# Patient Record
Sex: Female | Born: 1995 | Hispanic: No | Marital: Single | Smoking: Current every day smoker
Health system: Southern US, Community
[De-identification: ages and names within clinical notes are randomized; demographics above are authoritative.]

## PROBLEM LIST (undated history)

## (undated) DIAGNOSIS — B9689 Other specified bacterial agents as the cause of diseases classified elsewhere: Secondary | ICD-10-CM

## (undated) DIAGNOSIS — R569 Unspecified convulsions: Secondary | ICD-10-CM

## (undated) DIAGNOSIS — N760 Acute vaginitis: Secondary | ICD-10-CM

## (undated) DIAGNOSIS — G40909 Epilepsy, unspecified, not intractable, without status epilepticus: Secondary | ICD-10-CM

## (undated) DIAGNOSIS — J45909 Unspecified asthma, uncomplicated: Secondary | ICD-10-CM

## (undated) DIAGNOSIS — F419 Anxiety disorder, unspecified: Secondary | ICD-10-CM

## (undated) HISTORY — DX: Epilepsy, unspecified, not intractable, without status epilepticus: G40.909

## (undated) HISTORY — PX: NO PAST SURGERIES: SHX2092

---
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
## 2014-02-12 DIAGNOSIS — G40909 Epilepsy, unspecified, not intractable, without status epilepticus: Secondary | ICD-10-CM

## 2014-02-12 HISTORY — DX: Epilepsy, unspecified, not intractable, without status epilepticus: G40.909

## 2014-03-03 DIAGNOSIS — G40209 Localization-related (focal) (partial) symptomatic epilepsy and epileptic syndromes with complex partial seizures, not intractable, without status epilepticus: Secondary | ICD-10-CM | POA: Insufficient documentation

## 2014-03-26 ENCOUNTER — Ambulatory Visit: Payer: Self-pay | Admitting: Neurology
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
## 2016-11-01 DIAGNOSIS — G40919 Epilepsy, unspecified, intractable, without status epilepticus: Secondary | ICD-10-CM | POA: Insufficient documentation

## 2017-05-13 ENCOUNTER — Ambulatory Visit
Admission: EM | Admit: 2017-05-13 | Discharge: 2017-05-13 | Disposition: A | Discharge status: Home / Self Care | Payer: BLUE CROSS/BLUE SHIELD | Attending: Family Medicine | Admitting: Family Medicine

## 2017-05-13 ENCOUNTER — Encounter: Payer: Self-pay | Admitting: Gynecology

## 2017-05-13 ENCOUNTER — Other Ambulatory Visit: Payer: Self-pay

## 2017-05-13 DIAGNOSIS — R05 Cough: Secondary | ICD-10-CM

## 2017-05-13 DIAGNOSIS — B9789 Other viral agents as the cause of diseases classified elsewhere: Secondary | ICD-10-CM | POA: Diagnosis not present

## 2017-05-13 DIAGNOSIS — J069 Acute upper respiratory infection, unspecified: Secondary | ICD-10-CM | POA: Diagnosis not present

## 2017-05-13 HISTORY — DX: Unspecified convulsions: R56.9

## 2017-05-13 HISTORY — DX: Anxiety disorder, unspecified: F41.9

## 2017-05-13 HISTORY — DX: Unspecified asthma, uncomplicated: J45.909

## 2017-05-13 NOTE — ED Provider Notes (Addendum)
[facility]    CSN: [id number] Arrival date & time: 05/13/17  1256     History   Chief Complaint Chief Complaint  Patient presents with  . Cough    HPI Taylor Oneill is a 21 y.o. female.    Cough  Associated symptoms: rhinorrhea   Associated symptoms: no headaches and no wheezing   URI  Presenting symptoms: congestion, cough and rhinorrhea   Onset quality:  Sudden Duration:  3 days Timing:  Constant Progression:  Unchanged Chronicity:  New Relieved by:  OTC medications Associated symptoms: no headaches, no sinus pain and no wheezing   Risk factors: not elderly, no chronic cardiac disease, no chronic kidney disease, no chronic respiratory disease, no diabetes mellitus, no immunosuppression, no recent illness and no recent travel     Past Medical History:  Diagnosis Date  . Anxiety   . Asthma   . Seizure (HCC)     There are no active problems to display for this patient.   History reviewed. No pertinent surgical history.  OB History    No data available       Home Medications    Prior to Admission medications   Medication Sig Start Date End Date Taking? Authorizing Provider  lamoTRIgine (LAMICTAL) 150 MG tablet Take 150 mg by mouth daily.   Yes [provider]    Family History Family History  Problem Relation Age of Onset  . Seizures Mother   . Migraines Mother     Social History Social History   Tobacco Use  . Smoking status: Current Some Day Smoker    Packs/day: 0.50  . Smokeless tobacco: Never Used  Substance Use Topics  . Alcohol use: Yes  . Drug use: No     Allergies   Tylagesic [acetaminophen]   Review of Systems Review of Systems  HENT: Positive for congestion and rhinorrhea. Negative for sinus pain.   Respiratory: Positive for cough. Negative for wheezing.   Neurological: Negative for headaches.     Physical Exam Triage Vital Signs ED Triage Vitals  Enc Vitals Group     BP 05/13/17 1331  116/79     Pulse Rate 05/13/17 1331 69     Resp 05/13/17 1331 16     Temp 05/13/17 1331 98.7 F (37.1 C)     Temp Source 05/13/17 1331 Oral     SpO2 05/13/17 1331 100 %     Weight 05/13/17 1324 160 lb (72.6 kg)     Height 05/13/17 1324 5\' 6"  (1.676 m)     Head Circumference --      Peak Flow --      Pain Score 05/13/17 1324 3     Pain Loc --      Pain Edu? --      Excl. in GC? --    No data found.  Updated Vital Signs BP 116/79 (BP Location: Left Arm)   Pulse 69   Temp 98.7 F (37.1 C) (Oral)   Resp 16   Ht 5\' 6"  (1.676 m)   Wt 160 lb (72.6 kg)   LMP 05/05/2017   SpO2 100%   BMI 25.82 kg/m   Visual Acuity Right Eye Distance:   Left Eye Distance:   Bilateral Distance:    Right Eye Near:   Left Eye Near:    Bilateral Near:     Physical Exam  Constitutional: She appears well-developed and well-nourished. No distress.  HENT:  Head: Normocephalic and atraumatic.  Right  Ear: Tympanic membrane, external ear and ear canal normal.  Left Ear: Tympanic membrane, external ear and ear canal normal.  Nose: Rhinorrhea present. No mucosal edema, nose lacerations, sinus tenderness, nasal deformity, septal deviation or nasal septal hematoma. No epistaxis.  No foreign bodies. Right sinus exhibits no maxillary sinus tenderness and no frontal sinus tenderness. Left sinus exhibits no maxillary sinus tenderness and no frontal sinus tenderness.  Mouth/Throat: Uvula is midline and mucous membranes are normal. Posterior oropharyngeal erythema present. No oropharyngeal exudate or posterior oropharyngeal edema. No tonsillar exudate.  Eyes: Conjunctivae and EOM are normal. Pupils are equal, round, and reactive to light. Right eye exhibits no discharge. Left eye exhibits no discharge. No scleral icterus.  Neck: Normal range of motion. Neck supple. No thyromegaly present.  Cardiovascular: Normal rate, regular rhythm and normal heart sounds.  Pulmonary/Chest: Effort normal and breath sounds  normal. No respiratory distress. She has no wheezes. She has no rales.  Lymphadenopathy:    She has no cervical adenopathy.  Skin: She is not diaphoretic.  Nursing note and vitals reviewed.    UC Treatments / Results  Labs (all labs ordered are listed, but only abnormal results are displayed) Labs Reviewed - No data to display  EKG  EKG Interpretation None       Radiology No results found.  Procedures Procedures (including critical care time)  Medications Ordered in UC Medications - No data to display   Initial Impression / Assessment and Plan / UC Course  I have reviewed the triage vital signs and the nursing notes.  Pertinent labs & imaging results that were available during my care of the patient were reviewed by me and considered in my medical decision making (see chart for details).      Final Clinical Impressions(s) / UC Diagnoses   Final diagnoses:  Viral URI with cough    ED Discharge Orders    None     1.  diagnosis reviewed with patient 2.  Recommend supportive treatment with increased fluids, rest, otc meds prn  3. Follow-up prn if symptoms worsen or don't improve  Controlled Substance Prescriptions  Controlled Substance Registry consulted? Not Applicable   Taylor Oneill, Taylor Devivo, MD 05/13/17 1354    Taylor Oneill, Romy Ipock, MD 05/13/17 [phone]

## 2017-05-13 NOTE — ED Triage Notes (Signed)
Patient c/o cough/ chest congestion / sinus problem x 3 days.

## 2017-11-22 ENCOUNTER — Ambulatory Visit
Admission: EM | Admit: 2017-11-22 | Discharge: 2017-11-22 | Disposition: A | Discharge status: Home / Self Care | Payer: Managed Care, Other (non HMO) | Attending: Family Medicine | Admitting: Family Medicine

## 2017-11-22 ENCOUNTER — Other Ambulatory Visit: Payer: Self-pay

## 2017-11-22 DIAGNOSIS — R1010 Upper abdominal pain, unspecified: Secondary | ICD-10-CM | POA: Diagnosis not present

## 2017-11-22 LAB — COMPREHENSIVE METABOLIC PANEL
ALT: 17 U/L (ref 0–44)
ANION GAP: 11 (ref 5–15)
AST: 20 U/L (ref 15–41)
Albumin: 4 g/dL (ref 3.5–5.0)
Alkaline Phosphatase: 59 U/L (ref 38–126)
BUN: 8 mg/dL (ref 6–20)
CHLORIDE: 105 mmol/L (ref 98–111)
CO2: 20 mmol/L — AB (ref 22–32)
CREATININE: 0.69 mg/dL (ref 0.44–1.00)
Calcium: 8.9 mg/dL (ref 8.9–10.3)
GFR calc non Af Amer: >60 mL/min (ref >60)
Glucose, Bld: 83 mg/dL (ref 70–99)
Potassium: 3.9 mmol/L (ref 3.5–5.1)
SODIUM: 136 mmol/L (ref 135–145)
Total Bilirubin: 0.8 mg/dL (ref 0.3–1.2)
Total Protein: 7.3 g/dL (ref 6.5–8.1)

## 2017-11-22 LAB — CBC WITH DIFFERENTIAL/PLATELET
Basophils Absolute: 0 10*3/uL (ref 0–0.1)
Basophils Relative: 0 %
EOS ABS: 0 10*3/uL (ref 0–0.7)
EOS PCT: 1 %
HCT: 38.5 % (ref 35.0–47.0)
Hemoglobin: 12.9 g/dL (ref 12.0–16.0)
LYMPHS ABS: 1.7 10*3/uL (ref 1.0–3.6)
LYMPHS PCT: 32 %
MCH: 29.5 pg (ref 26.0–34.0)
MCHC: 33.5 g/dL (ref 32.0–36.0)
MCV: 88.1 fL (ref 80.0–100.0)
MONO ABS: 0.4 10*3/uL (ref 0.2–0.9)
MONOS PCT: 8 %
Neutro Abs: 3.1 10*3/uL (ref 1.4–6.5)
Neutrophils Relative %: 59 %
PLATELETS: 275 10*3/uL (ref 150–440)
RBC: 4.36 MIL/uL (ref 3.80–5.20)
RDW: 12.6 % (ref 11.5–14.5)
WBC: 5.2 10*3/uL (ref 3.6–11.0)

## 2017-11-22 MED ORDER — PANTOPRAZOLE SODIUM 40 MG PO TBEC: 40.0000 mg | DELAYED_RELEASE_TABLET | Freq: Every day | ORAL | 3 refills | Status: DC

## 2017-11-22 NOTE — Discharge Instructions (Signed)
Try the medication.  If persists, recommend seeing GI (Dr. Servando SnareWohl her in [place]).  Take care  Dr. Adriana Simasook

## 2017-11-22 NOTE — ED Provider Notes (Signed)
[facility]    CSN: [id number] Arrival date & time: 11/22/17  1438  History   Chief Complaint Chief Complaint  Patient presents with  . Abdominal Pain   HPI  22 year old female presents with upper abdominal pain.  Patient reports a 4-day history of upper abdominal pain.  Patient states that it spans the entire upper abdomen.  No associated fever.  No nausea vomiting.  No diarrhea.  No constipation.  She states it is worse with activity.  Improves with rest.  No radiation.  She does drink alcohol.  She states that it is infrequent but she has several drinks when she drinks.  No medications or interventions tried.  No other associated symptoms.  No other complaints.  Past Medical History:  Diagnosis Date  . Anxiety   . Asthma   . Seizure [facility])    Past Surgical History:  Procedure Laterality Date  . NO PAST SURGERIES      OB History   None    Home Medications    Prior to Admission medications   Medication Sig Start Date End Date Taking? Authorizing Provider  lamoTRIgine (LAMICTAL) 150 MG tablet Take 150 mg by mouth daily.   Yes [provider]  pantoprazole (PROTONIX) 40 MG tablet Take 1 tablet (40 mg total) by mouth daily. 11/22/17   Tommie Sams, DO    Family History Family History  Problem Relation Age of Onset  . Seizures Mother   . Migraines Mother     Social History Social History   Tobacco Use  . Smoking status: Current Some Day Smoker    Packs/day: 0.50  . Smokeless tobacco: Never Used  Substance Use Topics  . Alcohol use: Yes  . Drug use: No     Allergies   Tylagesic [acetaminophen]   Review of Systems Review of Systems  Constitutional: Negative.   Gastrointestinal: Positive for abdominal pain. Negative for constipation, diarrhea, nausea and vomiting.   Physical Exam Triage Vital Signs ED Triage Vitals  Enc Vitals Group     BP 11/22/17 1524 121/83     Pulse Rate 11/22/17 1524 83     Resp 11/22/17 1524 18     Temp  11/22/17 1524 98.5 F (36.9 C)     Temp Source 11/22/17 1524 Oral     SpO2 11/22/17 1524 100 %     Weight 11/22/17 1522 163 lb (73.9 kg)     Height 11/22/17 1522 5\' 6"  (1.676 m)     Head Circumference --      Peak Flow --      Pain Score 11/22/17 1522 7     Pain Loc --      Pain Edu? --      Excl. in GC? --    No data found.  Updated Vital Signs BP 121/83 (BP Location: Left Arm)   Pulse 83   Temp 98.5 F (36.9 C) (Oral)   Resp 18   Ht 5\' 6"  (1.676 m)   Wt 163 lb (73.9 kg)   LMP 11/07/2017   SpO2 100%   BMI 26.31 kg/m   Visual Acuity Right Eye Distance:   Left Eye Distance:   Bilateral Distance:    Right Eye Near:   Left Eye Near:    Bilateral Near:     Physical Exam  Constitutional: She is oriented to person, place, and time. She appears well-developed. No distress.  Cardiovascular: Normal rate and regular rhythm.  Pulmonary/Chest: Effort normal and breath sounds  normal. She has no wheezes. She has no rales.  Abdominal: Soft.  Patient with epigastric tenderness to palpation.  Patient also reporting tenderness with right lower quadrant palpation.  No rebound or guarding.  Neurological: She is alert and oriented to person, place, and time.  Psychiatric: She has a normal mood and affect. Her behavior is normal.  Nursing note and vitals reviewed.  UC Treatments / Results  Labs (all labs ordered are listed, but only abnormal results are displayed) Labs Reviewed  COMPREHENSIVE METABOLIC PANEL - Abnormal; Notable for the following components:      Result Value   CO2 20 (*)    All other components within normal limits  CBC WITH DIFFERENTIAL/PLATELET    EKG None  Radiology No results found.  Procedures Procedures (including critical care time)  Medications Ordered in UC Medications - No data to display  Initial Impression / Assessment and Plan / UC Course  I have reviewed the triage vital signs and the nursing notes.  Pertinent labs & imaging results  that were available during my care of the patient were reviewed by me and considered in my medical decision making (see chart for details).    22 year old female presents with upper abdominal pain.63  Labs unremarkable.  Unclear etiology at this time.  Could be MSK given history.  Other potential etiologies include GERD, gastritis, peptic ulcer disease.  Icing on Protonix.  Advised to see GI if she fails to improve or worsens.  Final Clinical Impressions(s) / UC Diagnoses   Final diagnoses:  Upper abdominal pain     Discharge Instructions     Try the medication.  If persists, recommend seeing GI (Dr. Servando SnareWohl her in [place]).  Take care  Dr. Adriana Simasook    ED Prescriptions    Medication Sig Dispense Auth. Provider   pantoprazole (PROTONIX) 40 MG tablet Take 1 tablet (40 mg total) by mouth daily. 30 tablet Tommie Samsook, Li Fragoso G, DO     Controlled Substance Prescriptions [place] Controlled Substance Registry consulted? Not Applicable   Tommie SamsCook, Kynnadi Dicenso G, DO 11/22/17 1624

## 2017-11-22 NOTE — ED Triage Notes (Signed)
Patient complains of generalized abdominal pain that she states that feels like razor blades. Patient denies nausea, has had normal BM. Patient states that moving around makes the pain worse but sleeping she does not notice it.

## 2018-06-26 ENCOUNTER — Telehealth (HOSPITAL_COMMUNITY): Payer: Self-pay | Admitting: Professional

## 2018-06-26 NOTE — Telephone Encounter (Signed)
Cln returned mother's call. 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 Pt has struggled with depression and anxiety in the past. Pt reports to mother that she is unable to get out of bed, cries all day, and is starting to have thoughts of harming self. Pt is returning from university this weekend and mother would like to schedule ax for next week. [facility] and [facility] states "that sounds [facility]." Pt scheduled for ax on Monday, 2/17 @ 2:30, told to arrive at 1:30 for pw. Mother asked if pw could be completed early; cln told her she could pick up paperwork early from front desk if wants: would need to arrive at 2:15 for ax with completed pw. Cln will leave pw at front desk. Mother asked about attending ax with pt; Cln told mother that would be up to pt. Mother understood. Address and phone number provided.

## 2018-07-01 ENCOUNTER — Other Ambulatory Visit (HOSPITAL_COMMUNITY): Payer: Managed Care, Other (non HMO) | Attending: Psychiatry | Admitting: Licensed Clinical Social Worker

## 2018-07-01 DIAGNOSIS — F332 Major depressive disorder, recurrent severe without psychotic features: Secondary | ICD-10-CM

## 2018-07-02 ENCOUNTER — Other Ambulatory Visit (HOSPITAL_COMMUNITY): Payer: 59 | Admitting: Occupational Therapy

## 2018-07-02 ENCOUNTER — Encounter (HOSPITAL_COMMUNITY): Payer: Self-pay | Admitting: Occupational Therapy

## 2018-07-02 ENCOUNTER — Other Ambulatory Visit: Payer: Self-pay

## 2018-07-02 ENCOUNTER — Other Ambulatory Visit (HOSPITAL_COMMUNITY): Payer: 59 | Attending: Psychiatry | Admitting: Licensed Clinical Social Worker

## 2018-07-02 DIAGNOSIS — F070 Personality change due to known physiological condition: Secondary | ICD-10-CM | POA: Diagnosis not present

## 2018-07-02 DIAGNOSIS — F332 Major depressive disorder, recurrent severe without psychotic features: Secondary | ICD-10-CM | POA: Insufficient documentation

## 2018-07-02 DIAGNOSIS — Z885 Allergy status to narcotic agent status: Secondary | ICD-10-CM | POA: Insufficient documentation

## 2018-07-02 DIAGNOSIS — F419 Anxiety disorder, unspecified: Secondary | ICD-10-CM | POA: Insufficient documentation

## 2018-07-02 DIAGNOSIS — J45909 Unspecified asthma, uncomplicated: Secondary | ICD-10-CM | POA: Insufficient documentation

## 2018-07-02 DIAGNOSIS — Z79899 Other long term (current) drug therapy: Secondary | ICD-10-CM | POA: Diagnosis not present

## 2018-07-02 DIAGNOSIS — G4700 Insomnia, unspecified: Secondary | ICD-10-CM | POA: Insufficient documentation

## 2018-07-02 DIAGNOSIS — Z818 Family history of other mental and behavioral disorders: Secondary | ICD-10-CM | POA: Diagnosis not present

## 2018-07-02 DIAGNOSIS — F17210 Nicotine dependence, cigarettes, uncomplicated: Secondary | ICD-10-CM | POA: Diagnosis not present

## 2018-07-02 DIAGNOSIS — Z888 Allergy status to other drugs, medicaments and biological substances status: Secondary | ICD-10-CM | POA: Diagnosis not present

## 2018-07-02 DIAGNOSIS — R4589 Other symptoms and signs involving emotional state: Secondary | ICD-10-CM
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
## 2018-07-02 NOTE — Therapy (Signed)
[facility] [address] [place], [place], [address] Phone: [phone]   Fax:  [phone]  Occupational Therapy Evaluation  Patient Details  Name: Taylor Oneill MRN: [id number] Date of Birth: 02/10/1996 Referring Provider (OT): Louisa Secondlgierd Pucilowski, MD   Encounter Date: 07/02/2018  OT End of Session - 07/02/18 1325    Visit Number  1    Number of Visits  16    Date for OT Re-Evaluation  07/30/18    Authorization Type  [employer]    OT Start Time  1030    OT Stop Time  1200    OT Time Calculation (min)  90 min    Activity Tolerance  Patient tolerated treatment well    Behavior During Therapy  [facility] for tasks assessed/performed       Past Medical History:  Diagnosis Date  . Anxiety   . Asthma   . Seizure [facility](HCC)     Past Surgical History:  Procedure Laterality Date  . NO PAST SURGERIES      There were no vitals filed for this visit.  Subjective Assessment - 07/02/18 1318    Currently in Pain?  Yes    Pain Score  3     Pain Location  Ankle    Pain Orientation  Left    Pain Descriptors / Indicators  Sore    Pain Type  Acute pain        [facility] OT Assessment - 07/02/18 0001      Assessment   Medical Diagnosis  Major depressive disorder, recurrent severe without psychotic features    Referring Provider (OT)  Louisa Secondlgierd Pucilowski, MD    Onset Date/Surgical Date  07/02/18      Precautions   Precautions  None      Restrictions   Weight Bearing Restrictions  No      Balance Screen   Has the patient fallen in the past 6 months  No    Has the patient had a decrease in activity level because of a fear of falling?   Yes   pt with recent ankle sprain, wearing supportive brace   Is the patient reluctant to leave their home because of a fear of falling?   No        OT assessment: OCAIRS  Diagnosis: Major depressive disorder, current episode severe, without psychotic features  Past medical history/referral  information: [facility] symptoms of depression/anxiety that lead to her dropping out of school and moving back in with her parents.  Living situation: Pt recently moved back in with parents, which was ~3 days ago  ADLs/IADLs: Pt reports a decreased motivation with this area. She shares her appetite is decreased, she mentions decreased appetite and guilt when eating more than twice a day  Sleep: Pt reports 4-5 hours of sleep  Work/school: Pt was previously enrolled in school full time (english and psychology), but withdrew 3 days ago. She mentions working during school, but is not doing any currently  Leisure: Pt reports enjoying writing and reading   Social support: Mentions having fewer friends in this area than in school, but has reached out to an old friend to socialize  Struggles: eating, sleep, goals   OCAIRS Mental Health Interview Summary of Client Scores:  FACILITATES PARTICIPATION IN OCCUPATION  ALLOWS PARTICIPATION IN OCCUPATION INHIBITS PARTICIPATION IN OCCUPATION RESTRICTS PARTICIPATION IN OCCUPATION COMMENTS  ROLES  X Recent role loss  HABITS                  X No established routine after recent move  PERSONAL CAUSATION                 X  Difficulty thinking into future plans  VALUES                 X  No clear values stated  INTERESTS                 X  Reports areas of interest, not actively engaging  SKILLS                 X  Difficulty concentrating  SHORT TERM GOALS                  X "I've tried to set goals and did not reach them, so I stopped and do not want to do it again"  LONG TERM GOALS                  X   INTERPETATION OF PAST EXPERIENCES                X   Equal emphasis  PHYSICAL ENVIRONMENT                X   Close to baseline, new appointments needs support due to issue with change and new people  SOCIAL ENVIRONMENT                 X  No friends here but starting to make pro social contact  READINESS FOR  CHANGE                 X  Difficultly adapting     Need for Occupational Therapy:  4 Shows positive occupational participation, no need for OT.   3 Need for minimal intervention/consultative participation    X 2 Need for OT intervention indicated to restore/improve participation   1 Need for extensive OT intervention indicated to improve participation.  Referral for follow up services also recommended.    Assessment:  Patient demonstrates behavior that inhibits participation in occupation.  Patient will benefit from occupational therapy intervention in order to improve time management, financial management, stress management, job readiness skills, social skills, and health management skills in preparation to return to full time community living and to be a productive community member.    Plan:  Patient will participate in skilled occupational therapy sessions individually or in a group setting to improve coping skills, psychosocial skills, and emotional skills required to return to prior level of function.  Treatment will be 4 times per week for 4 weeks.       S: "My self esteem is low"  O: Education given on self esteem and its implications to daily life. Pt asked to define self esteem and share with group. Additionally, pt to brainstorm both positive and negative contributors to self esteem that they have experienced personally. Positive thinking activity then completed for pt to list several positive qualities about themselves. Pt asked to share at end of group.  A: Pt presents to group with blunted affect, engaged and participator throughout entirety of session. Pt shares that social media comparisons and not meeting goals decreases her self esteem. While engaging in something new and being successful helps increase her self esteem. Pt completed positive thinking activity, sharing that her favorite  quality is her being a good big sister to her 9 siblings.  P: OT treatment will continue 4x  per week while pt in PHP           OT Education - 07/02/18 1322    Education Details  education given on self esteem skills    Person(s) Educated  Patient    Methods  Explanation;Handout    Comprehension  Verbalized understanding       OT Short Term Goals - 07/02/18 1328      OT SHORT TERM GOAL #1   Title  Pt will be educated on strategies to improve psyschosocial skills needed to participate fully in all daily, work, and leisure activities    Time  4    Period  Weeks    Status  New    Target Date  07/30/18      OT SHORT TERM GOAL #2   Title  Pt will apply psychosocial skills and coping mechanisms to daily activities in order to function independently and reintegrate into community dwelling    Time  4    Period  Weeks    Status  New    Target Date  07/30/18      OT SHORT TERM GOAL #3   Title  Pt will recall and/or apply 1-3 sleep hygiene strategies to increase functional BADL routine    Time  4    Period  Weeks    Status  New    Target Date  07/30/18      OT SHORT TERM GOAL #4   Title  Pt will engage in goal setting to improve functional BADL/IADL routine upon reintegrating into community    Time  4    Period  Weeks    Status  New    Target Date  07/30/18      OT SHORT TERM GOAL #5   Title  Pt will create and or apply BADL routine to improve quality and frequency of eating meals    Time  4    Period  Weeks    Status  New    Target Date  07/30/18               Plan - 07/02/18 1326    Occupational performance deficits (Please refer to evaluation for details):  ADL's;IADL's;Rest and Sleep;Education;Work;Leisure;Social Participation    Rehab Potential  Good    OT Frequency  4x / week    OT Duration  4 weeks    OT Treatment/Interventions  Coping strategies training;Psychosocial skills training;Self-care/ADL training;Other (comment)   community reintegration   Consulted and Agree with Plan of Care  Patient       Patient will benefit from skilled  therapeutic intervention in order to improve the following deficits and impairments:  Decreased coping skills, Decreased psychosocial skills, Other (comment)(decreased ability to engage in BADL and reintegrate into community)  Visit Diagnosis: Organic personality disorder  Difficulty coping    Problem List There are no active problems to display for this patient.  Dalphine Handing, MSOT, OTR/L Behavioral Health OT/ Acute Relief OT PHP Office: [phone]  Dalphine Handing 07/02/2018, 1:43 PM  [facility] PARTIAL HOSPITALIZATION PROGRAM [address] [place], [place], [address] Phone: [phone]   Fax:  [phone]  Name: Taylor Oneill MRN: [id number] Date of Birth: 04/07/1996

## 2018-07-03 ENCOUNTER — Other Ambulatory Visit (HOSPITAL_COMMUNITY): Payer: 59 | Admitting: Licensed Clinical Social Worker

## 2018-07-03 ENCOUNTER — Encounter (HOSPITAL_COMMUNITY): Payer: Self-pay

## 2018-07-03 VITALS — Ht 67.0 in | Wt 181.0 lb

## 2018-07-03 DIAGNOSIS — F332 Major depressive disorder, recurrent severe without psychotic features: Secondary | ICD-10-CM | POA: Diagnosis not present

## 2018-07-03 MED ORDER — MIRTAZAPINE 15 MG PO TABS: 15.0000 mg | ORAL_TABLET | Freq: Every day | ORAL | 0 refills | Status: DC

## 2018-07-03 MED ORDER — MIRTAZAPINE 7.5 MG PO TABS: 7.5000 mg | ORAL_TABLET | Freq: Every day | ORAL | 0 refills | Status: DC
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
## 2018-07-04 ENCOUNTER — Other Ambulatory Visit (HOSPITAL_COMMUNITY): Payer: 59 | Admitting: Licensed Clinical Social Worker

## 2018-07-04 ENCOUNTER — Encounter (HOSPITAL_COMMUNITY): Payer: Self-pay

## 2018-07-04 ENCOUNTER — Other Ambulatory Visit (HOSPITAL_COMMUNITY): Payer: 59 | Admitting: Occupational Therapy

## 2018-07-04 DIAGNOSIS — R4589 Other symptoms and signs involving emotional state: Secondary | ICD-10-CM

## 2018-07-04 DIAGNOSIS — F332 Major depressive disorder, recurrent severe without psychotic features: Secondary | ICD-10-CM

## 2018-07-04 DIAGNOSIS — F070 Personality change due to known physiological condition: Secondary | ICD-10-CM

## 2018-07-04 NOTE — Therapy (Signed)
[facility] PARTIAL HOSPITALIZATION PROGRAM [address] [place], [place], [address] Phone: [phone]   Fax:  [phone]  Occupational Therapy Treatment  Patient Details  Name: Taylor Oneill MRN: [id number] Date of Birth: [birth year] Referring Provider (OT): Louisa Second, MD   Encounter Date: 07/04/2018  OT End of Session - 07/04/18 1304    Visit Number  2    Number of Visits  16    Date for OT Re-Evaluation  07/30/18    Authorization Type  [employer]    OT Start Time  1100    OT Stop Time  1200    OT Time Calculation (min)  60 min    Activity Tolerance  Patient tolerated treatment well    Behavior During Therapy  [facility] for tasks assessed/performed       Past Medical History:  Diagnosis Date  . Anxiety   . Asthma   . Epilepsy (HCC) 02/2014  . Seizure [facility])     Past Surgical History:  Procedure Laterality Date  . NO PAST SURGERIES    . WISDOM TOOTH EXTRACTION Bilateral 03/2010   Both bottom wisdom teeth.    There were no vitals filed for this visit.  Subjective Assessment - 07/04/18 1304    Currently in Pain?  Other (Comment)   ankle pain from sprain      S: "I feel like a burden when I rely on my friends when I know they have their own stressors"   O:Eduation given on importance of social participation when reintegrating into community. Further education given on varying types of social participation (emotional, tangible, informational, and social needs), how they can be of use, the barriers, and how to apply them to current problems. Additional information given on community options for socially engaging leisure activities. Pt to choose one area this date that will help improve social participation.   A: Pt presents to group with flat affect, engaged and participatory throughout entirety of session. Pt shares that her social supports are various friends. She shares that they live in different areas, making face to face  socialization more difficult. One of the friends she has recently reached out to and lives closer in the area, showing improvement for this pt considering she has been isolative. Pt shares she feels guilty asking others for help, and feels like a burden, which causes her to isolate. She shares that her goal is to have a conversation with her friends that allows them to know when/when not she is in a good place and can/cannot ask them for help.  P: OT will continue to follow up on social participation information for increased implementation into daily routine.                     OT Education - 07/04/18 1304    Education Details  education given on social participation    Person(s) Educated  Patient    Methods  Explanation;Handout    Comprehension  Verbalized understanding       OT Short Term Goals - 07/04/18 1305      OT SHORT TERM GOAL #1   Title  Pt will be educated on strategies to improve psyschosocial skills needed to participate fully in all daily, work, and leisure activities    Time  4    Period  Weeks    Status  On-going    Target Date  07/30/18      OT SHORT TERM GOAL #2  Title  Pt will apply psychosocial skills and coping mechanisms to daily activities in order to function independently and reintegrate into community dwelling    Time  4    Period  Weeks    Status  On-going    Target Date  07/30/18      OT SHORT TERM GOAL #3   Title  Pt will recall and/or apply 1-3 sleep hygiene strategies to increase functional BADL routine    Time  4    Period  Weeks    Status  On-going    Target Date  07/30/18      OT SHORT TERM GOAL #4   Title  Pt will engage in goal setting to improve functional BADL/IADL routine upon reintegrating into community    Time  4    Period  Weeks    Status  On-going    Target Date  07/30/18      OT SHORT TERM GOAL #5   Title  Pt will create and or apply BADL routine to improve quality and frequency of eating meals    Time  4     Period  Weeks    Status  On-going    Target Date  07/30/18               Plan - 07/04/18 1304    Occupational performance deficits (Please refer to evaluation for details):  ADL's;IADL's;Rest and Sleep;Education;Work;Leisure;Social Participation       Patient will benefit from skilled therapeutic intervention in order to improve the following deficits and impairments:  Decreased coping skills, Decreased psychosocial skills, Other (comment)(decreased ability to engage in BADL and reintegrate into community)  Visit Diagnosis: Organic personality disorder  Difficulty coping    Problem List There are no active problems to display for this patient.  Dalphine Handing, MSOT, OTR/L Behavioral Health OT/ Acute Relief OT PHP Office: [phone]  Dalphine Handing 07/04/2018, 1:06 PM  [facility] HOSPITALIZATION PROGRAM [address] [place], [place], [address] Phone: [phone]   Fax:  [phone]  Name: Taylor Oneill MRN: [id number] Date of Birth: September 04, 1995

## 2018-07-05 ENCOUNTER — Ambulatory Visit (HOSPITAL_COMMUNITY): Payer: Self-pay

## 2018-07-05 ENCOUNTER — Other Ambulatory Visit (HOSPITAL_COMMUNITY): Payer: Self-pay

## 2018-07-08 ENCOUNTER — Other Ambulatory Visit (HOSPITAL_COMMUNITY): Payer: Self-pay

## 2018-07-08 ENCOUNTER — Telehealth (HOSPITAL_COMMUNITY): Payer: Self-pay | Admitting: Professional

## 2018-07-08 ENCOUNTER — Ambulatory Visit (HOSPITAL_COMMUNITY): Payer: Self-pay

## 2018-07-08 NOTE — Telephone Encounter (Signed)
Cln called to f/u with pt since she has not been in PHP for 2 days. Pt reports remron is making her very drowsy. Cln encouraged pt to rest and come to group tomorrow for med adjustment and reminded pt about attendance policy and that pt could be d/c since tomorrow would be 3rd day missed. Pt understands and agrees to come tomorrow. Pt reports she feels safe.

## 2018-07-09 ENCOUNTER — Other Ambulatory Visit (HOSPITAL_COMMUNITY): Payer: 59 | Admitting: Licensed Clinical Social Worker

## 2018-07-09 ENCOUNTER — Encounter (HOSPITAL_COMMUNITY): Payer: Self-pay | Admitting: Occupational Therapy

## 2018-07-09 ENCOUNTER — Other Ambulatory Visit (HOSPITAL_COMMUNITY): Payer: 59 | Admitting: Occupational Therapy

## 2018-07-09 DIAGNOSIS — F070 Personality change due to known physiological condition: Secondary | ICD-10-CM

## 2018-07-09 DIAGNOSIS — F332 Major depressive disorder, recurrent severe without psychotic features: Secondary | ICD-10-CM

## 2018-07-09 DIAGNOSIS — R4589 Other symptoms and signs involving emotional state: Secondary | ICD-10-CM

## 2018-07-09 MED ORDER — SERTRALINE HCL 50 MG PO TABS: 50.0000 mg | ORAL_TABLET | Freq: Every day | ORAL | 0 refills | Status: DC

## 2018-07-09 MED ORDER — TRAZODONE HCL 50 MG PO TABS: 50.0000 mg | ORAL_TABLET | Freq: Every evening | ORAL | 0 refills | Status: DC | PRN

## 2018-07-09 NOTE — Therapy (Signed)
[facility] PARTIAL HOSPITALIZATION PROGRAM [address] [place], [place], [address] Phone: [phone]   Fax:  [phone]  Occupational Therapy Treatment  Patient Details  Name: Taylor Oneill MRN: [id number] Date of Birth: 12/18/1995 Referring Provider (OT): Louisa Second, MD   Encounter Date: 07/09/2018  OT End of Session - 07/09/18 1334    Visit Number  3    Number of Visits  16    Date for OT Re-Evaluation  07/30/18    Authorization Type  [employer]    OT Start Time  1100    OT Stop Time  1200    OT Time Calculation (min)  60 min    Activity Tolerance  Patient tolerated treatment well    Behavior During Therapy  [facility] for tasks assessed/performed       Past Medical History:  Diagnosis Date  . Anxiety   . Asthma   . Epilepsy (HCC) 02/2014  . Seizure [facility])     Past Surgical History:  Procedure Laterality Date  . NO PAST SURGERIES    . WISDOM TOOTH EXTRACTION Bilateral 03/2010   Both bottom wisdom teeth.    There were no vitals filed for this visit.  Subjective Assessment - 07/09/18 1334    Currently in Pain?  Other (Comment)   ankle pain      S: "I am very passive"   O: Education and activities given in reference to increase assertiveness skills within daily life and relationships. Assertiveness quiz given to increase insight on pt current skills and how to improve based on given score. Further education given on assertive conversation, situations, body language, and appropriate context for skill. Worksheet given to identify three definitions (assertive, passive, and aggressive) with opportunity for role play between 2 participants in to promote assertiveness training in a variety of social settings (individuals in community and health care providers). Pt asked to identify one area to increase assertiveness this date. Further education given on the importance of assertiveness mentors and online research to continue skill building in  this area increase quality of life.   A: Pt presents to group with blunted affect, engaged and participatory- seen taking notes. Pt completed assertiveness quiz showing she is naturally passive and needs to actively work on assertiveness skills- pt in agreeance with this score. Pt in understanding of education given, stating she can be very passive or passive aggressive. Educated and challenged pt to role play with therapist, pt still needing cues to complete assertive statements at this time. Pt chose Hotel manager. Will continue to follow up for successful implementation.  P: Pt provided with assertiveness skills to implement into a variety of daily social situations. OT will continue to follow up with communication skills for successful implementation into daily life.                    OT Education - 07/09/18 1334    Education Details  education given on assertive communication skills    Person(s) Educated  Patient    Methods  Explanation;Handout    Comprehension  Verbalized understanding       OT Short Term Goals - 07/04/18 1305      OT SHORT TERM GOAL #1   Title  Pt will be educated on strategies to improve psyschosocial skills needed to participate fully in all daily, work, and leisure activities    Time  4    Period  Weeks    Status  On-going  Target Date  07/30/18      OT SHORT TERM GOAL #2   Title  Pt will apply psychosocial skills and coping mechanisms to daily activities in order to function independently and reintegrate into community dwelling    Time  4    Period  Weeks    Status  On-going    Target Date  07/30/18      OT SHORT TERM GOAL #3   Title  Pt will recall and/or apply 1-3 sleep hygiene strategies to increase functional BADL routine    Time  4    Period  Weeks    Status  On-going    Target Date  07/30/18      OT SHORT TERM GOAL #4   Title  Pt will engage in goal setting to improve functional BADL/IADL routine upon reintegrating  into community    Time  4    Period  Weeks    Status  On-going    Target Date  07/30/18      OT SHORT TERM GOAL #5   Title  Pt will create and or apply BADL routine to improve quality and frequency of eating meals    Time  4    Period  Weeks    Status  On-going    Target Date  07/30/18               Plan - 07/09/18 1335    Occupational performance deficits (Please refer to evaluation for details):  ADL's;IADL's;Rest and Sleep;Education;Work;Leisure;Social Participation       Patient will benefit from skilled therapeutic intervention in order to improve the following deficits and impairments:  Decreased coping skills, Decreased psychosocial skills, Other (comment)(decreased ability to engage in BADL and reintegrate into community)  Visit Diagnosis: Organic personality disorder  Difficulty coping    Problem List There are no active problems to display for this patient.  Dalphine Handing, MSOT, OTR/L Behavioral Health OT/ Acute Relief OT PHP Office: [phone]  Dalphine Handing 07/09/2018, 1:39 PM  [facility] PARTIAL HOSPITALIZATION PROGRAM [address] [place], [place], [address] Phone: [phone]   Fax:  [phone]  Name: Taylor Oneill MRN: [id number] Date of Birth: 09-28-1995
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
## 2018-07-10 ENCOUNTER — Other Ambulatory Visit (HOSPITAL_COMMUNITY): Payer: 59 | Admitting: Licensed Clinical Social Worker

## 2018-07-10 DIAGNOSIS — F332 Major depressive disorder, recurrent severe without psychotic features: Secondary | ICD-10-CM

## 2018-07-10 NOTE — Progress Notes (Addendum)
GROUP NOTE - spiritual care group 07/10/2018 11:00 - 12:00 ?Facilitated by Counseling intern Gus Height and Chaplain Burnis Kingfisher, MDiv, [facility].  ? ? Group focused on topic of strength. ?Group members reflected on what thoughts and feelings emerge when they hear this topic. ?They then engaged in facilitated dialog around how strength is present in their lives. This dialog focused on representing what strength had been to them in their lives (images and patterns given) and what they saw as helpful in their life now (what they needed / wanted). ? ? Activity drew on narrative framework   Was alert and present throughout group. Engaged upon prompting of the facilitators and participated in activity through written expression. Expressed that she has felt pressure to "be everything to everybody" and "put myself on the back burner" in the past. She related a time that she missed a job interview to take care of a friend that was needing emotional support. She shared that she is currently shifting her focus to prioritizing her own well-being

## 2018-07-11 ENCOUNTER — Other Ambulatory Visit (HOSPITAL_COMMUNITY): Payer: 59 | Admitting: Occupational Therapy

## 2018-07-11 ENCOUNTER — Encounter (HOSPITAL_COMMUNITY): Payer: Self-pay | Admitting: Occupational Therapy

## 2018-07-11 ENCOUNTER — Encounter (HOSPITAL_COMMUNITY): Payer: Self-pay

## 2018-07-11 ENCOUNTER — Other Ambulatory Visit (HOSPITAL_COMMUNITY): Payer: 59 | Admitting: Licensed Clinical Social Worker

## 2018-07-11 VITALS — BP 116/80 | HR 92 | Ht 67.0 in | Wt 179.0 lb

## 2018-07-11 DIAGNOSIS — F070 Personality change due to known physiological condition: Secondary | ICD-10-CM

## 2018-07-11 DIAGNOSIS — F332 Major depressive disorder, recurrent severe without psychotic features: Secondary | ICD-10-CM

## 2018-07-11 DIAGNOSIS — R4589 Other symptoms and signs involving emotional state: Secondary | ICD-10-CM

## 2018-07-11 NOTE — Therapy (Signed)
[facility] PARTIAL HOSPITALIZATION PROGRAM [address] [place], [place], [address] Phone: [phone]   Fax:  [phone]  Occupational Therapy Treatment  Patient Details  Name: Taylor Oneill MRN: [id number] Date of Birth: 1995-05-26 Referring Provider (OT): Louisa Second, MD   Encounter Date: 07/11/2018  OT End of Session - 07/11/18 1427    Visit Number  4    Number of Visits  16    Date for OT Re-Evaluation  07/30/18    Authorization Type  [employer]    OT Start Time  1100    OT Stop Time  1200    OT Time Calculation (min)  60 min    Activity Tolerance  Patient tolerated treatment well    Behavior During Therapy  [facility] for tasks assessed/performed       Past Medical History:  Diagnosis Date  . Anxiety   . Asthma   . Epilepsy (HCC) 02/2014  . Seizure [facility])     Past Surgical History:  Procedure Laterality Date  . NO PAST SURGERIES    . WISDOM TOOTH EXTRACTION Bilateral 03/2010   Both bottom wisdom teeth.    There were no vitals filed for this visit.  Subjective Assessment - 07/11/18 1426    Currently in Pain?  Other (Comment)   ankle pain      S: "I have an issue with eye contact"   O: Education given on communication skills, breaking down into three parts: nonverbal, verbal, and listening. Nonverbal communication discussed in detail this date in regard to definition, appropriate nonverbals, and how to improve personal nonverbals. Video clip used to display variety of nonverbals to facilitate further discussion. Nonverbal communication continually described in reference to electronic communication. Pt encouraged to share personal strengths and weaknesses to improve current communication skills to increase independent community reintegration. Verbal and listening communication skills to be continued next date.   A: Pt presents to group with blunted affect, engaged and participatory throughout session. Pt shares she has difficulty  with eye contact, tone, and not being too sarcastic. She also shares that she would like to become more aware of her facial expressions. Pt in understanding of education, giving examples of how to improve current practices.   P: Pt provided with communication skills to implement when reintegrating into community dwelling. OT will continue follow up with communication skills for successful implementation in daily life.                     OT Education - 07/11/18 1426    Education Details  education given on nonverbal skills    Person(s) Educated  Patient    Methods  Explanation;Handout    Comprehension  Verbalized understanding       OT Short Term Goals - 07/04/18 1305      OT SHORT TERM GOAL #1   Title  Pt will be educated on strategies to improve psyschosocial skills needed to participate fully in all daily, work, and leisure activities    Time  4    Period  Weeks    Status  On-going    Target Date  07/30/18      OT SHORT TERM GOAL #2   Title  Pt will apply psychosocial skills and coping mechanisms to daily activities in order to function independently and reintegrate into community dwelling    Time  4    Period  Weeks    Status  On-going    Target Date  07/30/18      OT SHORT TERM GOAL #3   Title  Pt will recall and/or apply 1-3 sleep hygiene strategies to increase functional BADL routine    Time  4    Period  Weeks    Status  On-going    Target Date  07/30/18      OT SHORT TERM GOAL #4   Title  Pt will engage in goal setting to improve functional BADL/IADL routine upon reintegrating into community    Time  4    Period  Weeks    Status  On-going    Target Date  07/30/18      OT SHORT TERM GOAL #5   Title  Pt will create and or apply BADL routine to improve quality and frequency of eating meals    Time  4    Period  Weeks    Status  On-going    Target Date  07/30/18               Plan - 07/11/18 1427    Occupational performance deficits  (Please refer to evaluation for details):  ADL's;IADL's;Rest and Sleep;Education;Work;Leisure;Social Participation       Patient will benefit from skilled therapeutic intervention in order to improve the following deficits and impairments:  Decreased coping skills, Decreased psychosocial skills, Other (comment)(decreased ability to engage in BADL and reintegrate into community)  Visit Diagnosis: Organic personality disorder  Difficulty coping    Problem List There are no active problems to display for this patient.  Dalphine Handing, MSOT, OTR/L Behavioral Health OT/ Acute Relief OT PHP Office: [phone]   Dalphine Handing 07/11/2018, 2:28 PM  [facility] HOSPITALIZATION PROGRAM [address] [place], [place], [address] Phone: [phone]   Fax:  [phone]  Name: Taylor Oneill MRN: [id number] Date of Birth: August 31, 1995

## 2018-07-11 NOTE — Progress Notes (Signed)
Patient presented with anxious affect, depressed mood and shared she had ran into her ex-boyfriend at a bar with other friends this past Saturday.  Patient stated she did not speak with him but did have some self harm thoughts after and acknowledged she was missing him some. Patient denied any current suicidal or homicidal ideations, no auditory or visual hallucinations and no plan or intent to want to harm self or others now.  Patient stated it had been 2000 days since the last time she had done any self harm and was not going to break this streak.  States she is using distraction and other skills to help with managing thoughts regarding her past relationship.  Patient reported she had take Trazodone for the first time the previous night and slept great, more than 8 hours uninterrupted and sound.  Patient did reporte some increased anxiety over the past 2-3 days with report of finding it more difficult to sit still and to "stay in the moment". Patient used the example of Yoga the previous day and that during that time it was extremely anxiety provoking as she could not shut her mind down to just relax.  Patient states this is the case by history some but also reports has been worse the past 2-3 days which is the same timeframe since she started Zoloft.  Patient to keep an eye on this and will share with [facility] nurse practitioner or PHP staff if continues.  Patient agreed to also use learned skills to help her with managing anxiety and with being more assertive, something she states has also always been difficult for her.  Patient overall reports improvement.  Sleeping and eating better and rates depression a 3, anxiety an 8, and hopelessness a 5 on a scale of 0-10 with 0 being none and 10 the worst she could manage.  Patient agreed to inform if any worsening of symptoms or if thoughts of self-harm returned.

## 2018-07-12 ENCOUNTER — Encounter (HOSPITAL_COMMUNITY): Payer: Self-pay | Admitting: Occupational Therapy

## 2018-07-12 ENCOUNTER — Other Ambulatory Visit (HOSPITAL_COMMUNITY): Payer: 59 | Admitting: Licensed Clinical Social Worker

## 2018-07-12 ENCOUNTER — Other Ambulatory Visit (HOSPITAL_COMMUNITY): Payer: 59 | Attending: Psychiatry | Admitting: Occupational Therapy

## 2018-07-12 DIAGNOSIS — F070 Personality change due to known physiological condition: Secondary | ICD-10-CM

## 2018-07-12 DIAGNOSIS — R4589 Other symptoms and signs involving emotional state: Secondary | ICD-10-CM

## 2018-07-12 DIAGNOSIS — F332 Major depressive disorder, recurrent severe without psychotic features: Secondary | ICD-10-CM | POA: Diagnosis not present
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
## 2018-07-12 NOTE — Psych (Signed)
   [facility] BH PHP THERAPIST PROGRESS NOTE  Taylor Oneill [id number]  Session Time: 9:00 - 11:00  Participation Level: Active  Behavioral Response: CasualAlertDepressed  Type of Therapy: Group Therapy  Treatment Goals addressed: Coping  Interventions: CBT, DBT, Solution Focused, Supportive and Reframing  Summary: Taylor Oneill is a 23 y.o. female who presents with depression symptoms. Patient arrived within time allowed and reports that she is "kind of anxious." Patient rates her mood at a "5" on a scale of 1-10 with 10 being great. Pt reports "last night was pretty decent" and that she got to spend time with her younger brother. Pt reports she struggled when he went to bed due to feeling lonely and tried to watch old cartoons to calm herself down. Pt also adds that her toes are feeling numb due to over-doing PT for her ankle yesterday. Patient engaged in discussion.       Session Time: 11:00 -12:00  Participation Level:Active  Behavioral Response:CasualAlertDepressed  Type of Therapy: Group Therapy, OT  Treatment Goals addressed: Coping  Interventions:Psychosocial skills training, Supportive,   Summary:Occupational Therapy group  Therapist Response: Patient engaged in group. See OT note.        Session Time: 12:00- 12:45  Participation Level:Active  Behavioral Response:CasualAlertDepressed  Type of Therapy: Group Therapy  Treatment Goals addressed: Coping  Interventions:Social Skills Training, Supportive  Summary:Reflection Group: Patients encouraged to practice skills and interpersonal techniques or work on mindfulness and relaxation techniques. The importance of self-care and making skills part of a routine to increase usage were stressed   Therapist Response: Patient engaged and participated appropriately.         Session Time: 12:45- 2:00  Participation Level:Active  Behavioral  Response:CasualAlertDepressed  Type of Therapy: GroupTherapy, Psychoeducation, psychotherapy  Treatment Goals addressed: Coping  Interventions:CBT, DBT, solution focused, Supportive; Reframing  Summary:12:45 - 1:50 Cln introduced topic of distress tolerance reviewing their purpose and how they are used. Cln introduced ACCEPTS skill. Group discussed "A-C-C" and how they can utilize the skills in the present moment.   1:50 - 12:00 Clinician led check-out. Clinician assessed for immediate needs, medication compliance and efficacy, and safety concerns.   Therapist Response: Patient engaged in group. Pt identified drawing and watching TV as ways to practice  "A," activities.    At check-out, patient rates her mood at a 6 on a scale of 1-10 with 10 being great. Patient reports afternoon plans of cooking dinner with her mom and playing a video game with her brother. Patient demonstrates some progress as evidenced by participating in first session. Patient denies SI/HI/self-harm at the end of group     Suicidal/Homicidal: Nowithout intent/plan   Plan: Pt will continue in PHP while working to decrease depression symptoms and increasing ability to manage symptoms in a healthy manner.   Diagnosis: Severe episode of recurrent major depressive disorder, without psychotic features (HCC) [F33.2]    1. Severe episode of recurrent major depressive disorder, without psychotic features (HCC)       Donia Guiles, LCSW 07/12/2018

## 2018-07-12 NOTE — Therapy (Signed)
[facility] PARTIAL HOSPITALIZATION PROGRAM [address] [place], [place], [address] Phone: [phone]   Fax:  [phone]  Occupational Therapy Treatment  Patient Details  Name: Taylor Oneill MRN: [id number] Date of Birth: 19-Jan-1996 Referring Provider (OT): Louisa Second, MD   Encounter Date: 07/12/2018  OT End of Session - 07/12/18 1331    Visit Number  5    Number of Visits  16    Date for OT Re-Evaluation  07/30/18    Authorization Type  [employer]    OT Start Time  1100    OT Stop Time  1200    OT Time Calculation (min)  60 min    Activity Tolerance  Patient tolerated treatment well    Behavior During Therapy  [facility] for tasks assessed/performed       Past Medical History:  Diagnosis Date  . Anxiety   . Asthma   . Epilepsy (HCC) 02/2014  . Seizure [facility])     Past Surgical History:  Procedure Laterality Date  . NO PAST SURGERIES    . WISDOM TOOTH EXTRACTION Bilateral 03/2010   Both bottom wisdom teeth.    There were no vitals filed for this visit.  Subjective Assessment - 07/12/18 1331    Currently in Pain?  No/denies       S: "I want to improve my giving feedback skills"   O: OT treatment focus on communication skills, with emphasis of session on active listening. Ice breaker activity given with focus of active listening in pairs given at start of session. Pt received handout to guide understanding of definition of active listening and how to improve skills this date. Pt asked to share personal experiences and examples throughout session. Video clip shown of active listening example, discussion at end of session given.  A: Pt presented to group with blunted affect, engaged and participatory throughout session. Pt engaged in ice breaker, needing min-mod VC's to complete activity successfully. Pt received active listening handout, in understanding of ways to improve current practices. Pt wanting to use the skill "feedback", she  feels as if she can improve this with her friendships. She shares that the active listening skill will be better than advising people, which she has done in the past and created conflict. Pt shares that the video helps show open mindedness with communication skills.  P: Pt provided with communication skills to implement when reintegrating into community dwelling. OT will continue follow up with communication skills for successful implementation in daily life                     OT Education - 07/12/18 1331    Education Details  education given on active listening communication skills    Person(s) Educated  Patient    Methods  Explanation;Handout    Comprehension  Verbalized understanding       OT Short Term Goals - 07/04/18 1305      OT SHORT TERM GOAL #1   Title  Pt will be educated on strategies to improve psyschosocial skills needed to participate fully in all daily, work, and leisure activities    Time  4    Period  Weeks    Status  On-going    Target Date  07/30/18      OT SHORT TERM GOAL #2   Title  Pt will apply psychosocial skills and coping mechanisms to daily activities in order to function independently and reintegrate into community dwelling  Time  4    Period  Weeks    Status  On-going    Target Date  07/30/18      OT SHORT TERM GOAL #3   Title  Pt will recall and/or apply 1-3 sleep hygiene strategies to increase functional BADL routine    Time  4    Period  Weeks    Status  On-going    Target Date  07/30/18      OT SHORT TERM GOAL #4   Title  Pt will engage in goal setting to improve functional BADL/IADL routine upon reintegrating into community    Time  4    Period  Weeks    Status  On-going    Target Date  07/30/18      OT SHORT TERM GOAL #5   Title  Pt will create and or apply BADL routine to improve quality and frequency of eating meals    Time  4    Period  Weeks    Status  On-going    Target Date  07/30/18                Plan - 07/12/18 1331    Occupational performance deficits (Please refer to evaluation for details):  ADL's;IADL's;Rest and Sleep;Education;Work;Leisure;Social Participation       Patient will benefit from skilled therapeutic intervention in order to improve the following deficits and impairments:  (decreased ability to engage in BADL and reintegrate into community)  Visit Diagnosis: Organic personality disorder  Difficulty coping    Problem List There are no active problems to display for this patient.  Dalphine Handing, MSOT, OTR/L Behavioral Health OT/ Acute Relief OT PHP Office: [phone]  Dalphine Handing 07/12/2018, 1:32 PM  [facility] HOSPITALIZATION PROGRAM [address] [place], [place], [address] Phone: [phone]   Fax:  [phone]  Name: Taylor Oneill MRN: [id number] Date of Birth: [birth year]

## 2018-07-13 ENCOUNTER — Encounter (HOSPITAL_COMMUNITY): Payer: Self-pay | Admitting: Family

## 2018-07-15 ENCOUNTER — Encounter (HOSPITAL_COMMUNITY): Payer: Self-pay | Admitting: Occupational Therapy

## 2018-07-15 ENCOUNTER — Other Ambulatory Visit (HOSPITAL_COMMUNITY): Payer: 59 | Attending: Psychiatry | Admitting: Licensed Clinical Social Worker

## 2018-07-15 ENCOUNTER — Other Ambulatory Visit (HOSPITAL_COMMUNITY): Payer: 59 | Admitting: Occupational Therapy

## 2018-07-15 DIAGNOSIS — F17210 Nicotine dependence, cigarettes, uncomplicated: Secondary | ICD-10-CM | POA: Insufficient documentation

## 2018-07-15 DIAGNOSIS — G40909 Epilepsy, unspecified, not intractable, without status epilepticus: Secondary | ICD-10-CM | POA: Diagnosis not present

## 2018-07-15 DIAGNOSIS — F419 Anxiety disorder, unspecified: Secondary | ICD-10-CM | POA: Insufficient documentation

## 2018-07-15 DIAGNOSIS — J45909 Unspecified asthma, uncomplicated: Secondary | ICD-10-CM | POA: Diagnosis not present

## 2018-07-15 DIAGNOSIS — F332 Major depressive disorder, recurrent severe without psychotic features: Secondary | ICD-10-CM | POA: Diagnosis present

## 2018-07-15 DIAGNOSIS — R4589 Other symptoms and signs involving emotional state: Secondary | ICD-10-CM

## 2018-07-15 DIAGNOSIS — R45851 Suicidal ideations: Secondary | ICD-10-CM | POA: Diagnosis not present

## 2018-07-15 DIAGNOSIS — F070 Personality change due to known physiological condition: Secondary | ICD-10-CM

## 2018-07-15 DIAGNOSIS — G4700 Insomnia, unspecified: Secondary | ICD-10-CM | POA: Diagnosis not present

## 2018-07-15 DIAGNOSIS — Z79899 Other long term (current) drug therapy: Secondary | ICD-10-CM | POA: Insufficient documentation

## 2018-07-15 NOTE — Therapy (Signed)
[facility] PARTIAL HOSPITALIZATION PROGRAM [address] [place], [place], [address] Phone: [phone]   Fax:  [phone]  Occupational Therapy Treatment  Patient Details  Name: Taylor Oneill MRN: [id number] Date of Birth: [birth year] Referring Provider (OT): Louisa Second, MD   Encounter Date: 07/15/2018  OT End of Session - 07/15/18 1244    Visit Number  6    Number of Visits  16    Date for OT Re-Evaluation  07/30/18    Authorization Type  [employer]    OT Start Time  1100    OT Stop Time  1200    OT Time Calculation (min)  60 min    Activity Tolerance  Patient tolerated treatment well    Behavior During Therapy  [facility] for tasks assessed/performed       Past Medical History:  Diagnosis Date  . Anxiety   . Asthma   . Epilepsy (HCC) 02/2014  . Seizure [facility])     Past Surgical History:  Procedure Laterality Date  . NO PAST SURGERIES    . WISDOM TOOTH EXTRACTION Bilateral 03/2010   Both bottom wisdom teeth.    There were no vitals filed for this visit.  Subjective Assessment - 07/15/18 1244    Currently in Pain?  No/denies       S: "I am looking to apply for this new job"  O: Education given on goal setting to help improve routine management and increase motivation while reintegrating into daily routines. Pt to choose from the following areas: work/school, social participation, physical health, family, leisure, and personality. Pt to create immediate, short, and medium term goals based off the SMART framework. Pt asked to share and discuss at end of session. Blank planner handouts given for pt to continue goal setting in daily routine.  A: Pt presents to group with blunted affect, engaged and participatory throughout session. Pt set goals around a resume, new job, physical health, and organization in the home/routine. OT to follow up on vocational skill building. Pt shares she would like to explore the idea of an Media planner.  P: OT group will be 4x per week while pt in PHP.                    OT Education - 07/15/18 1244    Education Details  education given on goal setting tips    Person(s) Educated  Patient    Methods  Explanation;Handout    Comprehension  Verbalized understanding       OT Short Term Goals - 07/04/18 1305      OT SHORT TERM GOAL #1   Title  Pt will be educated on strategies to improve psyschosocial skills needed to participate fully in all daily, work, and leisure activities    Time  4    Period  Weeks    Status  On-going    Target Date  07/30/18      OT SHORT TERM GOAL #2   Title  Pt will apply psychosocial skills and coping mechanisms to daily activities in order to function independently and reintegrate into community dwelling    Time  4    Period  Weeks    Status  On-going    Target Date  07/30/18      OT SHORT TERM GOAL #3   Title  Pt will recall and/or apply 1-3 sleep hygiene strategies to increase functional BADL routine    Time  4  Period  Weeks    Status  On-going    Target Date  07/30/18      OT SHORT TERM GOAL #4   Title  Pt will engage in goal setting to improve functional BADL/IADL routine upon reintegrating into community    Time  4    Period  Weeks    Status  On-going    Target Date  07/30/18      OT SHORT TERM GOAL #5   Title  Pt will create and or apply BADL routine to improve quality and frequency of eating meals    Time  4    Period  Weeks    Status  On-going    Target Date  07/30/18               Plan - 07/15/18 1244    Occupational performance deficits (Please refer to evaluation for details):  ADL's;IADL's;Rest and Sleep;Education;Work;Leisure;Social Participation    Pt will benefit from skilled therapeutic intervention in order to improve on the following performance deficits  Psychosocial Skills;Cognitive Skills;Body Structure / Function / Physical Skills    Body Structure / Function / Physical Skills   ADL    Cognitive Skills  Energy/Drive;Temperament/Personality;Emotional    Psychosocial Skills  Coping Strategies;Habits;Routines and Behaviors;Environmental  Adaptations;Interpersonal Interaction       Patient will benefit from skilled therapeutic intervention in order to improve the following deficits and impairments:     Visit Diagnosis: Organic personality disorder  Difficulty coping    Problem List There are no active problems to display for this patient.  Dalphine Handing, MSOT, OTR/L Behavioral Health OT/ Acute Relief OT PHP Office: [phone]  Dalphine Handing 07/15/2018, 1:01 PM  [facility] HOSPITALIZATION PROGRAM [address] [place], [place], [address] Phone: [phone]   Fax:  [phone]  Name: Taylor Oneill MRN: [id number] Date of Birth: 07/21/1995

## 2018-07-16 ENCOUNTER — Encounter (HOSPITAL_COMMUNITY): Payer: Self-pay | Admitting: Occupational Therapy

## 2018-07-16 ENCOUNTER — Encounter (HOSPITAL_COMMUNITY): Payer: Self-pay | Admitting: Family

## 2018-07-16 ENCOUNTER — Other Ambulatory Visit (HOSPITAL_COMMUNITY): Payer: 59 | Admitting: Occupational Therapy

## 2018-07-16 ENCOUNTER — Other Ambulatory Visit (HOSPITAL_COMMUNITY): Payer: 59 | Admitting: Licensed Clinical Social Worker

## 2018-07-16 DIAGNOSIS — R4589 Other symptoms and signs involving emotional state: Secondary | ICD-10-CM

## 2018-07-16 DIAGNOSIS — F332 Major depressive disorder, recurrent severe without psychotic features: Secondary | ICD-10-CM

## 2018-07-16 DIAGNOSIS — F070 Personality change due to known physiological condition: Secondary | ICD-10-CM

## 2018-07-16 NOTE — Therapy (Signed)
[facility] [place], [place], [address] Phone: [phone]   Fax:  [phone]  Occupational Therapy Treatment  Patient Details  Name: Taylor Oneill MRN: [id number] Date of Birth: 1995/11/15 Referring Provider (OT): Maudry Mayhew, MD   Encounter Date: 07/16/2018  OT End of Session - 07/16/18 1256    Visit Number  7    Number of Visits  16    Date for OT Re-Evaluation  07/30/18    Authorization Type  [employer]    OT Start Time  1100    OT Stop Time  1200    OT Time Calculation (min)  60 min    Activity Tolerance  Patient tolerated treatment well    Behavior During Therapy  [facility] for tasks assessed/performed       Past Medical History:  Diagnosis Date  . Anxiety   . Asthma   . Epilepsy ([place]) 02/2014  . Seizure [facility])     Past Surgical History:  Procedure Laterality Date  . NO PAST SURGERIES    . WISDOM TOOTH EXTRACTION Bilateral 03/2010   Both bottom wisdom teeth.    There were no vitals filed for this visit.  Subjective Assessment - 07/16/18 1255    Currently in Pain?  No/denies       S: "I would really like to meet new people"   O: Education given on self care and its importance in regular BADL/IADL routine. Pt completed self care assessment to identify areas of strength and weakness. Self care assessments covered areas of physical health, psychological health, spiritual health, and professional health. Pt asked to identify areas of weakness within each area and develop plans for improvement this date. Pt encouraged to brainstorm with other peers to begin goal setting in areas of desired change.  ?  A: Pt presents to group with blunted affect, engaged and participatory throughout session. Pt completed assessment, sharing she has areas to improve in physical and emotional self care. She shares that she would like to increase the frequency and quality of her eating and to continue exercise. She  also shares that she would like to improve recognizing her strengths and achievements within herself without seeking validation from others. Pt showed OT at end of session a picture of a goal setting/time management strategy she had implemented in the home- with notable increase in affect and sense of pride.  P: Pt educated on importance of self care in BADL/IADL routine. OT will continue to follow up with pt each treatment session to ensure carryover into daily routine to facilitate successful community integration. OT treatment will be 4 times a week                     OT Education - 07/16/18 1255    Education Details  education given on self care     Person(s) Educated  Patient    Methods  Explanation;Handout    Comprehension  Verbalized understanding       OT Short Term Goals - 07/16/18 1319      OT SHORT TERM GOAL #1   Title  Pt will be educated on strategies to improve psyschosocial skills needed to participate fully in all daily, work, and leisure activities    Time  4    Period  Weeks    Status  Achieved    Target Date  07/30/18      OT SHORT TERM GOAL #2   Title  Pt will apply psychosocial skills and coping mechanisms to daily activities in order to function independently and reintegrate into community dwelling    Time  4    Period  Weeks    Status  Achieved    Target Date  07/30/18      OT SHORT TERM GOAL #3   Title  Pt will recall and/or apply 1-3 sleep hygiene strategies to increase functional BADL routine    Time  4    Period  Weeks    Status  Partially Met    Target Date  07/30/18      OT SHORT TERM GOAL #4   Title  Pt will engage in goal setting to improve functional BADL/IADL routine upon reintegrating into community    Time  4    Period  Weeks    Status  Achieved    Target Date  07/30/18      OT SHORT TERM GOAL #5   Title  Pt will create and or apply BADL routine to improve quality and frequency of eating meals    Time  4    Period   Weeks    Status  Achieved    Target Date  07/30/18               Plan - 07/16/18 1256    Occupational performance deficits (Please refer to evaluation for details):  ADL's;IADL's;Rest and Sleep;Education;Work;Leisure;Social Participation    Pt will benefit from skilled therapeutic intervention in order to improve on the following performance deficits  Psychosocial Skills;Cognitive Skills;Body Structure / Function / Physical Skills    Body Structure / Function / Physical Skills  ADL    Cognitive Skills  Energy/Drive;Temperament/Personality;Emotional    Psychosocial Skills  Coping Strategies;Habits;Routines and Behaviors;Environmental  Adaptations;Interpersonal Interaction       Patient will benefit from skilled therapeutic intervention in order to improve the following deficits and impairments:     Visit Diagnosis: Organic personality disorder  Difficulty coping    Problem List There are no active problems to display for this patient.      OCCUPATIONAL THERAPY DISCHARGE SUMMARY  Visits from Start of Care: 7  Current functional level related to goals / functional outcomes: Pt is stepping down to IOP level of care for continued MH maintenance and stabilization.   Remaining deficits: Continuing to implement skills learned   Education / Equipment: Education given on psychosocial and coping skills as it applies to increasing functional BADL/IADL routine in preparation for reintegrating into community. OT d/c handout give, pt in understanding. Sleep hygiene handout given for pt to supplement education for continued BADL routine improvement. Plan: Patient agrees to discharge.  Patient goals were partially met. Patient is being discharged due to being pleased with the current functional level.  ?????        Zenovia Jarred, MSOT, OTR/L Behavioral Health OT/ Acute Relief OT PHP Office: [phone]  Zenovia Jarred 07/16/2018, 1:20 PM  [facility] HOSPITALIZATION PROGRAM [place], [place], [address] Phone: [phone]   Fax:  [phone]  Name: Taylor Oneill MRN: [id number] Date of Birth: 1995-06-12

## 2018-07-16 NOTE — Psych (Signed)
   [facility] BH PHP THERAPIST PROGRESS NOTE  Taylor Oneill [id number]  Session Time: 9:00 - 11:00  Participation Level: Active  Behavioral Response: CasualAlertDepressed  Type of Therapy: Group Therapy  Treatment Goals addressed: Coping  Interventions: CBT, DBT, Solution Focused, Supportive and Reframing  Summary: Taylor Oneill is a 23 y.o. female who presents with depression symptoms. Patient arrived within time allowed and reports that she is "having higher anxiety because of the newness". Patient rates her mood at a 6 on a scale of 1-10 with 10 being great. Pt reports S/I while driving and managed by reframing intrusive thoughts with logical thoughts. Pt. states that she typically is not around so many people for such a long period of time and was mentally "exhausted yesterday" after group. Pt. states that it was socially challenging because she does not talk to many people on a regular basis. Pt states she slept well but longer than usual because if her exhaustion. Patient continues to struggle with challenging situations. Patient engaged in discussion.      Session Time: 11:00 -12:15  Participation Level: Active  Behavioral Response: CasualAlertDepressed  Type of Therapy: Group Therapy, psychotherapy  Treatment Goals addressed: Coping  Interventions: Strengths based, reframing, Supportive,   Summary:  Spiritual Care group  Therapist Response: Patient engaged in group. See chaplain note.         Session Time: 12:15 - 1:00  Participation Level: Active  Behavioral Response: CasualAlertDepressed  Type of Therapy: Group Therapy  Treatment Goals addressed: Coping  Interventions: Psychologist, occupational, Supportive  Summary:  Reflection Group: Patients encouraged to practice skills and interpersonal techniques or work on mindfulness and relaxation techniques. The importance of self-care and making skills part of a routine to increase usage were stressed    Therapist Response: Patient engaged and participated appropriately.        Session Time: 1:00- 2:00  Participation Level: Active  Behavioral Response: CasualAlertDepressed  Type of Therapy: Group Therapy, Psychoeducation  Treatment Goals addressed: Coping  Interventions: relaxation training; Supportive; Reframing  Summary: 12:45 - 1:50: Relaxation group: Cln led group focused on retraining the body's response to stress.   1:50 -2:00 Clinician led check-out. Clinician assessed for immediate needs, medication compliance and efficacy, and safety concerns   Therapist Response: Patient engaged in activity and discussion. At check-out, patient rates her mood at a 4 on a scale of 1-10 with 10 being great. Patient reports afternoon plans to go to pharmacy, walk and go to the tattoo shop. Patient demonstrates some progress as evidenced by sharing in-depth. Patient denies SI/HI/self-harm thoughts at the end of group.      Suicidal/Homicidal: Nowithout intent/plan   Plan: Pt will continue in PHP while working to decrease depression symptoms and increasing ability to manage symptoms in a healthy manner.   Diagnosis: Severe episode of recurrent major depressive disorder, without psychotic features (HCC) [F33.2]    1. Severe episode of recurrent major depressive disorder, without psychotic features (HCC)       Donia Guiles, LCSW 07/16/2018

## 2018-07-16 NOTE — Progress Notes (Signed)
  [facility] Behavioral Health Partial Hospitalization Outpatient Program Discharge Summary  Taylor Oneill [id number]   Admission date: 07/01/2018 Discharge date: 07/17/2018  Reason for admission:  Suicidal ideational and worsening anxiety   Per assessment note: Pt presents as self referral due to increasing depression symptoms, SI, and self harm thoughts which necessitated withdrawal from college and coming home. Pt states she has been attending [employer] the past 3 years and over the past school year she has had a drastic decline in ability to concentrate, decreased motivation, and increased depression and anxiety symptoms. Pt states that the school year prior she was on the Dean's List and then in Fall 2019 she failed 4 out of 6 of her classes and was "on track to do the same this semester." Pt states that she enjoys her classes she just cannot get herself to go to class or complete work. Pt reports poor/conflictual support; self medicating with substances; recent risky behaviors; and no recent treatment. Pt reports passive SI denying current plan or intent  Chemical Use History: was charted patient was using drugs "experimenting" since 2017. Currently denying substance use.  Family of Origin Issues: Reports her mother has been supportive during her admission. Reports she has learned  a lot with regards to her past family history.   Progress in Program Toward Treatment Goals: Ongoing, Taylor Oneill attended and participated with daily group sessions. Patient continues to express intermittent anxiety, however reports she is utilizing coping skills. Patient has declined medications due to reported side effects. Patient states taken Trazodone 50 mg PRN. NP will continue to follow-up while attending IOP   Progress (rationale): Referral to MD. Taylor Oneill, Patient is stepping down to Intensive Outpatient programing 07/18/2018. Orders placed to ADHD testing patient to keep all f/u appointments.     Take all medications as prescribed. Keep all follow-up appointments as scheduled.  Do not consume alcohol or use illegal drugs while on prescription medications. Report any adverse effects from your medications to your primary care provider promptly.  In the event of recurrent symptoms or worsening symptoms, call 911, a crisis hotline, or go to the nearest emergency department for evaluation.   Oneta Rack, NP 07/16/2018

## 2018-07-16 NOTE — Psych (Signed)
   [facility] BH PHP THERAPIST PROGRESS NOTE  CRISTEL SARSON [id number]  Session Time: 9:00 - 11:00  Participation Level: Active  Behavioral Response: CasualAlertDepressed  Type of Therapy: Group Therapy  Treatment Goals addressed: Coping  Interventions: CBT, DBT, Solution Focused, Supportive and Reframing  Summary: Taylor Oneill is a 23 y.o. female who presents with depression symptoms. Patient arrived within time allowed and reports that she is "not great." Patient rates her mood at a 4 on a scale of 1-10 with 10 being great. Pt reports "yesterday was really rough" and that the feeling is bleeding into today. Pt reports having to see her weight yesterday was difficult and caused battling thoughts about eating. Pt reports understanding she can do blind weigh-ins if she would prefer. Pt states feeling restless after group and not wanting to go home but feeling she had nowhere else to be. Pt states she had thoughts about cutting herself and managed by distracting herself and then taking a bath. Pt states she managed thoughts by reminding herself of the progress she has made and telling herself how many days it has been, 2009, since she last self harmed. Pt states later int he day a friend texted her "silly things" and that helped improve her mood as well. Pt is struggling with finding direction.  Patient engaged in discussion.       Session Time: 11:00 -12:00  Participation Level:Active  Behavioral Response:CasualAlertDepressed  Type of Therapy: Group Therapy, OT  Treatment Goals addressed: Coping  Interventions:Psychosocial skills training, Supportive,   Summary:Occupational Therapy group  Therapist Response: Patient engaged in group. See OT note.        Session Time: 12:00- 12:45  Participation Level:Active  Behavioral Response:CasualAlertDepressed  Type of Therapy: Group Therapy  Treatment Goals addressed:  Coping  Interventions:Social Skills Training, Supportive  Summary:Reflection Group: Patients encouraged to practice skills and interpersonal techniques or work on mindfulness and relaxation techniques. The importance of self-care and making skills part of a routine to increase usage were stressed   Therapist Response: Patient engaged and participated appropriately.         Session Time: 12:45- 2:00  Participation Level:Active  Behavioral Response:CasualAlertDepressed  Type of Therapy: GroupTherapy, Psychoeducation, psychotherapy  Treatment Goals addressed: Coping  Interventions:CBT, DBT, solution focused, Supportive; Reframing  Summary: 12:45 - 1:50: Clinician continued topic of distress tolerance skills and ACCEPTS. Group learned Different Emotion, Pushing away, Thoughts, and Sensation.  Pt's discussed how they can utilize these skills. 1:50 -2:00 Clinician led check-out. Clinician assessed for immediate needs, medication compliance and efficacy, and safety concerns   Therapist Response: Patient engaged activity and discussion. Pt reports "categories" as a way she can practice the Thoughts skill.  At check-out, patient rates her mood at a 7 on a scale of 1-10 with 10 being great. Patient reports plans no specific afternoon plans. Patient demonstrates some progress as evidenced by being self-motivated to increase progress in terms of self harm. Patient denies SI/HI/self-harm at the end of group.     Suicidal/Homicidal: Nowithout intent/plan   Plan: Pt will continue in PHP while working to decrease depression symptoms and increasing ability to manage symptoms in a healthy manner.   Diagnosis: Severe episode of recurrent major depressive disorder, without psychotic features (HCC) [F33.2]    1. Severe episode of recurrent major depressive disorder, without psychotic features (HCC)       Donia Guiles, LCSW 07/16/2018

## 2018-07-17 ENCOUNTER — Encounter (HOSPITAL_COMMUNITY): Payer: Self-pay | Admitting: Family

## 2018-07-17 ENCOUNTER — Other Ambulatory Visit (HOSPITAL_COMMUNITY): Payer: 59 | Admitting: Licensed Clinical Social Worker

## 2018-07-17 DIAGNOSIS — F332 Major depressive disorder, recurrent severe without psychotic features: Secondary | ICD-10-CM

## 2018-07-17 NOTE — Progress Notes (Signed)
CMA Regan placed referral on 07/17/2018 for ADHD testing see chart

## 2018-07-18 ENCOUNTER — Other Ambulatory Visit (HOSPITAL_COMMUNITY): Payer: Self-pay

## 2018-07-18 ENCOUNTER — Encounter (HOSPITAL_COMMUNITY): Payer: Self-pay | Admitting: Psychiatry

## 2018-07-18 ENCOUNTER — Ambulatory Visit (HOSPITAL_COMMUNITY): Payer: Self-pay

## 2018-07-18 ENCOUNTER — Other Ambulatory Visit (HOSPITAL_COMMUNITY): Payer: 59 | Admitting: Licensed Clinical Social Worker

## 2018-07-18 DIAGNOSIS — F332 Major depressive disorder, recurrent severe without psychotic features: Secondary | ICD-10-CM | POA: Diagnosis not present

## 2018-07-18 NOTE — Progress Notes (Signed)
Taylor Oneill is a 23 y.o., single, unemployed, Caucasian female who transitioned from [facility].  As previous [facility] note from PHP states:   Pt presents as self referral due to increasing depression symptoms, SI, and self harm thoughts which necessitated withdrawal from college and coming home. Pt states she has been attending [employer] the past 3 years and over the past school year she has had a drastic decline in ability to concnertrate, decreased motivation, and increased depression and anxiety symptoms. Pt states that the school year prior she was on the Dean's List and then in Fall 2019 she failed 4 out of 6 of her classes and was "on track to do the same this semester." Pt states that she enjoys her classes she just cannot get herself to go to class or complete work. Pt reports poor/conflictual support; self medicating with substances; recent risky behaviors; and no recent treatment. Pt reports passive SI denying current plan or intent.  Patients Currently Reported Symptoms/Problems: Pt states she is experiencing depression symptoms of depressed mood, low motivation, poor concentration, irritable, SI, self harm thoughts, and sleep disturbances. Pt reports having panic attacks every other day describing symptoms of tightness in chest, difficulty breathing, racing thoughts, shaking, and crying. Pt reports mood swings of irritability, anger, feeling "fine," and then sad in quick succession. Pt reports she will have periods of "hyperfixation" when she will get into a new hobby or 5 at a time and go all in for a day or two and then drop it. Pt reports some Pt reports drinking to feel numb or make it easier to be in social situations. Pt reports experimentation with drugs with no consistent use since 2017 except for stiumlants 1x/week to help her focus in school.  Individual's Strengths: Pt has some insight and is motivated for treatment.  Pt started MH-IOP today.  Reports continued passive SI (no plan  or intent).  Discussed safety options at length with pt.  Pt is able to contract for safety.  States she has a job interview today. A:  Oriented pt to MH-IOP.  Provided pt with an orientation folder.  Encouraged support groups.  R:  Pt receptive.         Chestine Spore, RITA, M.Ed,CNA

## 2018-07-18 NOTE — Psych (Signed)
   [facility] PHP THERAPIST PROGRESS NOTE  Taylor Oneill [id number]  Session Time: 9:00 - 11:00  Participation Level: Active  Behavioral Response: CasualAlertDepressed  Type of Therapy: Group Therapy  Treatment Goals addressed: Coping  Interventions: CBT, DBT, Solution Focused, Supportive and Reframing  Summary: Taylor Oneill is a 23 y.o. female who presents with depression symptoms. Patient arrived within time allowed and reports that she is "dissapointed." Patient rates her mood at a 4 on a scale of 1-10 with 10 being great. Pt reports the new sleep medication has made her "really tired" reporting she was in bed from 10 pm until 5 pm yesterday. Pt will speak to provider about this. Pt reports over the weekend she reconnected with some friends and it felt good to feel like she had support in the town she is staying in for now. Pt shares she also saw her ex for the first time over the weekend but "it was okay because my friends rallied around me." Pt states she has had some increased anxiety since seeing the ex though, because "now I know we will see each other out."  Pt is struggling with utilizing skills.  Patient engaged in discussion.       Session Time: 11:00 -12:00  Participation Level:Active  Behavioral Response:CasualAlertDepressed  Type of Therapy: Group Therapy, OT  Treatment Goals addressed: Coping  Interventions:Psychosocial skills training, Supportive,   Summary:Occupational Therapy group  Therapist Response: Patient engaged in group. See OT note.        Session Time: 12:00- 12:45  Participation Level:Active  Behavioral Response:CasualAlertDepressed  Type of Therapy: Group Therapy  Treatment Goals addressed: Coping  Interventions:Social Skills Training, Supportive  Summary:Reflection Group: Patients encouraged to practice skills and interpersonal techniques or work on mindfulness and relaxation techniques.  The importance of self-care and making skills part of a routine to increase usage were stressed   Therapist Response: Patient engaged and participated appropriately.         Session Time: 12:45- 2:00  Participation Level:Active  Behavioral Response:CasualAlertDepressed  Type of Therapy: GroupTherapy, Psychoeducation, psychotherapy  Treatment Goals addressed: Coping  Interventions:CBT, DBT, solution focused, Supportive; Reframing  Summary: 12:45 - 1:50: Clinician introduced topic of cognitive distortions. Cln educated on what cognitive distortions are and how they affect [place]. Cln introduced "Catch, Challenge, Change" and group reviewed cognitive distortion handout and came up with examples to work on "catch." 1:50 -2:00 Clinician led check-out. Clinician assessed for immediate needs, medication compliance and efficacy, and safety concerns   Therapist Response: Patient engaged activity and discussion. Pt was able to identify real life examples of cognitive distortions discussed.  At check-out, patient rates her mood at a 7 on a scale of 1-10 with 10 being great. Patient reports afternoon plans of picking up her prescriptions, doing laundry, and experimenting with an old camera her aunt gave her. Patient demonstrates some progress as evidenced by trying to increase social support. Patient denies SI/HI/self-harm at the end of group.     Suicidal/Homicidal: Nowithout intent/plan   Plan: Pt will continue in PHP while working to decrease depression symptoms and increasing ability to manage symptoms in a healthy manner.   Diagnosis: Severe episode of recurrent major depressive disorder, without psychotic features (HCC) [F33.2]    1. Severe episode of recurrent major depressive disorder, without psychotic features (HCC)       Donia Guiles, LCSW 07/18/2018

## 2018-07-18 NOTE — Psych (Signed)
   [facility] BH PHP THERAPIST PROGRESS NOTE  Taylor Oneill [id number]  Session Time: 9:00 - 11:00  Participation Level: Active  Behavioral Response: CasualAlertDepressed  Type of Therapy: Group Therapy  Treatment Goals addressed: Coping  Interventions: CBT, DBT, Solution Focused, Supportive and Reframing  Summary: Clinician led check-in regarding current stressors and situation, and review of patient completed daily inventory. Clinician utilized active listening and empathetic response and validated patient emotions. Clinician facilitated processing group on pertinent issues.   Therapist Response:  Taylor Oneill is a 23 y.o. female who presents with depression symptoms. Patient arrived within time allowed and reports that she "had an interesting morning". Patient rates her mood at a 6 on a scale of 1-10 with 10 being great. Pt. reports that she spoke with ex-boyfriend about phone bill yesterday which was difficult, but she was proud of herself for getting it done. Pt. states she dealt with insurance stuff and then went for a "nice drive" in the afternoon. Pt. states after she got home from drive, "I felt impulsive, so I dyed my hair".  Pt states it was either that or reach out to her ex-boyfriend again and she feels she made the better decision. Patient continues to struggle with being present in the moment. Patient engaged in discussion.       Session Time: 11:00 -12:15  Participation Level: Active  Behavioral Response: CasualAlertDepressed  Type of Therapy: Group Therapy, psychotherapy  Treatment Goals addressed: Coping  Interventions: Strengths based, reframing, Supportive,   Summary:  Spiritual Care group  Therapist Response: Patient engaged in group. See chaplain note.         Session Time: 12:15 - 1:00  Participation Level: Active  Behavioral Response: CasualAlertDepressed  Type of Therapy: Group Therapy  Treatment Goals addressed:  Coping  Interventions: Psychologist, occupational, Supportive  Summary:  Reflection Group: Patients encouraged to practice skills and interpersonal techniques or work on mindfulness and relaxation techniques. The importance of self-care and making skills part of a routine to increase usage were stressed   Therapist Response: Patient engaged and participated appropriately.        Session Time: 1:00- 2:00  Participation Level: Active  Behavioral Response: CasualAlertDepressed  Type of Therapy: Group Therapy, Psychoeducation  Treatment Goals addressed: Coping  Interventions: relaxation training; Supportive; Reframing  Summary: 12:45 - 1:50: Relaxation group: Cln led group focused on retraining the body's response to stress.   1:50 -2:00 Clinician led check-out. Clinician assessed for immediate needs, medication compliance and efficacy, and safety concerns   Therapist Response: Patient engaged in activity and discussion. At check-out, patient rates her mood at a 4 on a scale of 1-10 with 10 being great. Pt. reports afternoon plans of doing laundry and hanging out at home. Patient demonstrates some progress as evidenced by redirecting impulsive actions. Patient denies SI/HI/self-harm thoughts at the end of group.      Suicidal/Homicidal: Nowithout intent/plan   Plan: Pt will continue in PHP while working to decrease depression symptoms and increasing ability to manage symptoms in a healthy manner.   Diagnosis: Severe episode of recurrent major depressive disorder, without psychotic features (HCC) [F33.2]    1. Severe episode of recurrent major depressive disorder, without psychotic features (HCC)       Donia Guiles, LCSW 07/18/2018

## 2018-07-19 ENCOUNTER — Other Ambulatory Visit (HOSPITAL_COMMUNITY): Payer: Self-pay

## 2018-07-19 ENCOUNTER — Ambulatory Visit (HOSPITAL_COMMUNITY): Payer: Self-pay

## 2018-07-19 ENCOUNTER — Encounter (HOSPITAL_COMMUNITY): Payer: Self-pay | Admitting: Family

## 2018-07-19 ENCOUNTER — Other Ambulatory Visit (HOSPITAL_COMMUNITY): Payer: 59 | Admitting: Licensed Clinical Social Worker

## 2018-07-19 DIAGNOSIS — F332 Major depressive disorder, recurrent severe without psychotic features: Secondary | ICD-10-CM
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
## 2018-07-22 ENCOUNTER — Ambulatory Visit (HOSPITAL_COMMUNITY): Payer: Self-pay

## 2018-07-22 ENCOUNTER — Other Ambulatory Visit (HOSPITAL_COMMUNITY): Payer: 59 | Admitting: Licensed Clinical Social Worker

## 2018-07-22 ENCOUNTER — Other Ambulatory Visit (HOSPITAL_COMMUNITY): Payer: Self-pay

## 2018-07-22 DIAGNOSIS — F332 Major depressive disorder, recurrent severe without psychotic features: Secondary | ICD-10-CM | POA: Diagnosis not present

## 2018-07-22 NOTE — Progress Notes (Signed)
    Daily Group Progress Note  Program: IOP  Group Time: 9am-12pm  Participation Level: Active  Behavioral Response: Appropriate  Type of Therapy:  Group Therapy  Summary of Progress:  The purpose of this group is to utilize CBT and DBT skills in a group setting to increase use of healthy coping skills and decrease frequency and intensity of active mental health symtpoms.  9am-11am Clinician checked in with group members, assessing for SI/HI/psychosis and overall level of functioning. Clinician and group members completed icebreaker activity with potiential journal topics focused on opportunities for self reflection. Clinician presented the topic of vulnerability. Clinician inquired what clients initially associated with vulnerability. Clinician pointed out and praised moments of vulnerability in group throughout the week. Clinician provided Dewain Penning video on ' The price of invulnerability.' Clinician facilitated processing of thoughts and feelings related to topics in the video and any changes in perception related to vulnerability in self. Clinicians and group members discussed barriers to showing vulnerability as well as the use of vulnerability to gain connection with others improving support systems and overall mental health.  11am-12pm Mindfulness group was co-facilitated focusing on mindfulness skills using breathing and yoga. Client completed IOP intake this day, see case management note for additional details. Client engaged in activities and discussions appropriately.  Harlon Ditty, LCSW

## 2018-07-23 ENCOUNTER — Other Ambulatory Visit (HOSPITAL_COMMUNITY): Payer: Self-pay

## 2018-07-23 ENCOUNTER — Other Ambulatory Visit (HOSPITAL_COMMUNITY): Payer: 59 | Admitting: Licensed Clinical Social Worker

## 2018-07-23 ENCOUNTER — Ambulatory Visit (HOSPITAL_COMMUNITY): Payer: Self-pay

## 2018-07-23 DIAGNOSIS — F332 Major depressive disorder, recurrent severe without psychotic features: Secondary | ICD-10-CM | POA: Diagnosis not present

## 2018-07-23 NOTE — Psych (Signed)
   [facility] BH PHP THERAPIST PROGRESS NOTE  Taylor Oneill [id number]  Session Time: 9:00 - 11:00  Participation Level: Active  Behavioral Response: CasualAlertDepressed  Type of Therapy: Group Therapy  Treatment Goals addressed: Coping  Interventions: CBT, DBT, Solution Focused, Supportive and Reframing  Summary: Taylor Oneill is a 23 y.o. female who presents with depression symptoms. Patient arrived within time allowed and reports that she is feeling "in a decent mood". Patient rates her mood at a 6 on a scale of 1-10 with 10 being great. Pt. states her mother is pressuring her about financial responsibilities but is not supportive about her getting a job right now and the pressure and lack of support are "making me more anxious". Pt states she did almost everything she wanted to do yesterday. Pt. reports high anxiety about the remaining things she needs to take care of in reference to long-term living situation. Pt states she felt lonely yesterday but used distractions by creating tasks and listening to music. Pt. reports when sad songs played she changed them so that they would not affect her mood negatively. Patient continues to struggle with assertiveness. Patient engaged in discussion.        Session Time: 11:00 -12:00  Participation Level:Active  Behavioral Response:CasualAlertDepressed  Type of Therapy: Group Therapy, OT  Treatment Goals addressed: Coping  Interventions:Psychosocial skills training, Supportive,   Summary:Occupational Therapy group  Therapist Response: Patient engaged in group. See OT note.        Session Time: 12:00- 12:45  Participation Level:Active  Behavioral Response:CasualAlertDepressed  Type of Therapy: Group Therapy  Treatment Goals addressed: Coping  Interventions:Social Skills Training, Supportive  Summary:Reflection Group: Patients encouraged to practice skills and interpersonal  techniques or work on mindfulness and relaxation techniques. The importance of self-care and making skills part of a routine to increase usage were stressed   Therapist Response: Patient engaged and participated appropriately.         Session Time: 12:45- 2:00  Participation Level:Active  Behavioral Response:CasualAlertDepressed  Type of Therapy: GroupTherapy, Psychoeducation, psychotherapy  Treatment Goals addressed: Coping  Interventions:CBT, DBT, solution focused, Supportive; Reframing  Summary: 12:45 - 1:50: Cln continued topic of cognitive distortions. Group discussed how to "challenge" the unhealthy thought patterns once recognized. Group reviewed "Socratic Questions" handout and went over how to challenge irrational thoughts using that handout. 1:50 -2:00 Clinician led check-out. Clinician assessed for immediate needs, medication compliance and efficacy, and safety concerns   Therapist Response: Patient engaged in activity. Pt states understanding of how to challenge unhealthy thoughts and successfully reframed examples in discussion.  At check-out, patient rates her mood at a 7 on a scale of 1-10 with 10 being great. Patient reports afternoon plans of maybe walking and taking pictures and looking for jobs. Patient demonstrates some progress as evidenced by practicing different emotion skill yesterday afternoon. Patient denies SI/HI/self-harm thoughts at the end of group.       Suicidal/Homicidal: Nowithout intent/plan   Plan: Pt will continue in PHP while working to decrease depression symptoms and increasing ability to manage symptoms in a healthy manner.   Diagnosis: Severe episode of recurrent major depressive disorder, without psychotic features (HCC) [F33.2]    1. Severe episode of recurrent major depressive disorder, without psychotic features (HCC)       Donia Guiles, LCSW 07/23/2018

## 2018-07-24 ENCOUNTER — Other Ambulatory Visit (HOSPITAL_COMMUNITY): Payer: Self-pay

## 2018-07-24 ENCOUNTER — Other Ambulatory Visit (HOSPITAL_COMMUNITY): Payer: 59 | Admitting: Psychiatry

## 2018-07-24 ENCOUNTER — Telehealth (HOSPITAL_COMMUNITY): Payer: Self-pay | Admitting: Psychiatry

## 2018-07-24 NOTE — Progress Notes (Signed)
    Daily Group Progress Note  Program: IOP  Group Time: 9am-12pm  Participation Level: Active  Behavioral Response: Appropriate  Type of Therapy:  Group Therapy; process group, psycho-educational group  Summary of Progress:  The purpose of this group is to utilize CBT and DBT skills in a group setting to increase use of healthy coping skills and decrease frequency and intensity of active mental health symtpoms.  9am-10:30am Clinician presented Happy Brain: How to Overcome Our Neural Predispositions to Suffering by Dr. Verna Czech.  Clinician presented psycho-educational information on automatic thinking and neural predispositions. Group members discussed negative adaptations including mind wandering, negativity bias. Educational information also included skills to improve moments of happiness by focusing on gratitude skills, non-judgmental stance, and reframing challenges to focus on compassion, acceptance, meaning, and forgiveness. Clinician and group members discussed recent life stressors and how these skills could be applied.  10:30am-12pm Clinician presented the topic of mindfulness in conjunction with psycho-educational materials focused vs wandering modes of thinking. Clinician and group members discussed core features of mindfulness and barriers for implementing. Clinician and group members practiced skill in session by participating in [place] meditation, with a focus on bringing the wandering mind back to the task in progress. Clinician and group members practiced being non-judgmental and participating fully by engaging in 'loaded questions' activity and showing moments of vulnerability which can be used as connection with others. Client engaged in group discussions and activities. Client reports enjoying the meditation with a tactile focus as she found it easier to manage mind-wandering.  Harlon Ditty, LCSW

## 2018-07-24 NOTE — Psych (Signed)
[facility] BH PHP THERAPIST PROGRESS NOTE  Taylor Oneill [id number]  Session Time: 9:00 - 10:15  Participation Level: Active  Behavioral Response: CasualAlertDepressed  Type of Therapy: Group Therapy  Treatment Goals addressed: Coping  Interventions: CBT, DBT, Solution Focused, Supportive and Reframing  Summary: Clinician led check-in regarding current stressors and situation, and review of patient completed daily inventory. Clinician utilized active listening and empathetic response and validated patient emotions. Clinician facilitated processing group on pertinent issues.    Therapist Response: AQUILLA LABORE is a 23 y.o. female who presents with depression symptoms. Patient arrived within time allowed and reports that she is feeling "very stressed out and frustrated with mom". Patient rates her mood at a 5 on a scale of 1-10 with 10 being great. Pt. reports she thinks the "meds are making me all weird" and she's having trouble adjusting to them. Pt. states she is stressed out because she will not be able to have a deposit ready for the apartment in [place] she is planning to take. Pt. states she has a rough time yesterday afternoon because she was upset about the apartment situation. Pt. states her mother had a bad day and got drunk when she got home, and it added to pt's stress. Patient continues to struggle with indecision. Patient engaged in discussion.        Session Time: 10:15 -11:00   Participation Level: Active   Behavioral Response: CasualAlertDepressed   Type of Therapy: Group Therapy, psychoeducation, psychotherapy   Treatment Goals addressed: Coping   Interventions: CBT, DBT, Solution Focused, Supportive and Reframing   Summary: Clinician led discussion on managing stressors during difficult and anxious situations. Patients discussed areas where they feel they could incorporate distraction skills to manage stress and anxiety.     Therapist  Response: Patient engaged and participated in discussion. Pt. identifies stressor of not getting tasks completed, states she can create tasks list to manage stressors safely.         Session Time: 11:00 -12:00  Participation Level:Active  Behavioral Response:CasualAlertDepressed  Type of Therapy: Group Therapy, OT  Treatment Goals addressed: Coping  Interventions:Psychosocial skills training, Supportive,   Summary:Occupational Therapy group  Therapist Response: Patient engaged in group. See OT note.        Session Time: 12:00- 1:00  Participation Level:Active  Behavioral Response:CasualAlertDepressed  Type of Therapy: GroupTherapy, Psychoeducation, psychotherapy  Treatment Goals addressed: Coping  Interventions:CBT, DBT, solution focused, Supportive; Reframing  Summary: 12:00 - 12:50: Clinician continued the topic of cognitive distortions. Group watched Felicity Pellegrini Talk illustrating ways to reframe and shift perspective.   12:50 -1:00 Clinician led check-out. Clinician assessed for immediate needs, medication compliance and efficacy, and safety concerns    Therapist Response: Patient engaged in discussion.  At check-out, patient rates her mood at an 8 on a scale of 1-10 with 10 being great. Patient reports weekend plans to speak with roommate's mother about apartment, drive to [place] and go out with friends. Patient demonstrates some progress as evidenced by utilizing assertiveness skills. Patient denies SI/HI/self-harm at the end of group.       Suicidal/Homicidal: Nowithout intent/plan   Plan: Pt will continue in PHP while working to decrease depression symptoms and increasing ability to manage symptoms in a healthy manner.   Diagnosis: Severe episode of recurrent major depressive disorder, without psychotic features (HCC) [F33.2]    1. Severe episode of recurrent major depressive disorder, without psychotic features (HCC)        Donia Guiles, LCSW 07/24/2018

## 2018-07-25 ENCOUNTER — Other Ambulatory Visit: Payer: Self-pay

## 2018-07-25 ENCOUNTER — Other Ambulatory Visit (HOSPITAL_COMMUNITY): Payer: 59 | Admitting: Licensed Clinical Social Worker

## 2018-07-25 ENCOUNTER — Other Ambulatory Visit (HOSPITAL_COMMUNITY): Payer: Self-pay

## 2018-07-25 DIAGNOSIS — F332 Major depressive disorder, recurrent severe without psychotic features: Secondary | ICD-10-CM | POA: Diagnosis not present

## 2018-07-25 NOTE — Progress Notes (Signed)
    Daily Group Progress Note  Program: IOP  Group Time: 9am-12pm  Participation Level: Active  Behavioral Response: Appropriate, Sharing and Motivated  Type of Therapy:  Group Therapy; psycho-educational group, process group  Summary of Progress: The purpose of this group is to utilize CBT and DBT skills in a group setting to increase use of healthy coping skills and decrease frequency and intensity of active mental health symptoms.  9am-10:30am Clinician checked in with group members, assessing for SI/HI/psychosis and overall level of functioning. Clinician inquired about completed self care activities from previous day. Clinician presented the topic of Mind Traps. Clinician reviewed with clients the connection between thoughts, feelings, and behaviors. Clinician emphasized that mind traps are learned ways of reacting to events in life, much like other patterns or habits and it is possible for [place] to learn new ways of thinking that help [place] avoid many of the emotional upheavals caused by our own mind traps. Clinician and group members reviewed each mind trap, provided examples from daily life, and practiced challenging each trap. Clinician and group members processed the impact of mind traps on mental health recovery. Clinician presented Progressive Muscle Relaxation activity for group members to participate. Clinician and group members compared this with yoga and the pebble meditation.  10:30am-12pm Clinician presented Roadblocks to Healthy Thinking that can interfere with change and contribute to relapse. Clinician and group members processed what Roadblocks have in common, why we use these, and the impact these have on relationships and mental health recovery. Clinician and group members utilized personal examples of how to address each type of roadblock and practice developing new thinking habits. Clinician validated client feelings that changing thought processing can be frustrating and is a  long term solution with repetitive practice. Clinician and group members reviewed Thinking and Behavior Cycle and focused on ways to break cycle of permission statements and unhelpful behaviors. Clinician checked out with group members planned self care activity for the day.  Client reports successfully getting a job, of which she is proud. Client identified several problematic ways of thinking which she uses. Client was able to challenge them in the group setting. Client identified barriers she put in place with her drinking, which is one of the topics related to her distorted thinking.  Harlon Ditty, LCSW

## 2018-07-25 NOTE — Psych (Signed)
   [facility] BH PHP THERAPIST PROGRESS NOTE  [name] [id number]  Session Time: 9:00 - 11:00  Participation Level: Active  Behavioral Response: CasualAlertDepressed  Type of Therapy: Group Therapy  Treatment Goals addressed: Coping  Interventions: CBT, DBT, Solution Focused, Supportive and Reframing  Summary: Taylor Oneill is a 23 y.o. female who presents with depression symptoms. Patient arrived within time allowed and reports she is feeling good. Patient rates her mood at a 8 on a scale of 1-10 with 10 being great. Pt. reports "I had a really good weekend" but is feeling anxious about things that need to get done. Pt. states she went to [place], hung out with friends, went out and parents came to help her pack things to bring back home. Pt. states that she spent some time with mom on Saturday, it went well and they "we had a good time" which was a nice change. Pt. states she went out with friends and acted as the designated driver which made her proud of making a healthy decision.  Patient continues to struggle being by herself. Pt able to process. Patient engaged in discussion.        Session Time: 11:00 -12:00  Participation Level:Active  Behavioral Response:CasualAlertDepressed  Type of Therapy: Group Therapy, OT  Treatment Goals addressed: Coping  Interventions:Psychosocial skills training, Supportive,   Summary:Occupational Therapy group  Therapist Response: Patient engaged in group. See OT note.        Session Time: 12:00- 12:45  Participation Level:Active  Behavioral Response:CasualAlertDepressed  Type of Therapy: Group Therapy  Treatment Goals addressed: Coping  Interventions:Social Skills Training, Supportive  Summary:Reflection Group: Patients encouraged to practice skills and interpersonal techniques or work on mindfulness and relaxation techniques. The importance of self-care and making skills part of a  routine to increase usage were stressed   Therapist Response: Patient engaged and participated appropriately.         Session Time: 12:45- 2:00  Participation Level:Active  Behavioral Response:CasualAlertDepressed  Type of Therapy: GroupTherapy, Psychoeducation, psychotherapy  Treatment Goals addressed: Coping  Interventions:CBT, DBT, solution focused, Supportive; Reframing  Summary: 12:45 - 1:50: Cln introduced topic of boundaries. Cln provided psychoeducation on what boundaries are, porous, rigid and healthy boundary characteristics, and the different types of boundaries. Group related the information to themselves to begin discovering potential boundary issues. 1:50 -2:00 Clinician led check-out. Clinician assessed for immediate needs, medication compliance and efficacy, and safety concerns   Therapist Response: Patient engaged in activity. Pt reports understanding of boundaries, identifying she has porous boundaries.  At check-out, patient rates her mood at a 8 on a scale of 1-10 with 10 being great. Patient reports afternoon plans to go with brother to deliver present, watch movie with brother and open package that arrived today. Patient demonstrates some progress as evidenced by trying to decrease isolation. Patient denies SI/HI/self-harm at the end of group.      Suicidal/Homicidal: Nowithout intent/plan   Plan: Pt will continue in PHP while working to decrease depression symptoms and increasing ability to manage symptoms in a healthy manner.   Diagnosis: Severe episode of recurrent major depressive disorder, without psychotic features (HCC) [F33.2]    1. Severe episode of recurrent major depressive disorder, without psychotic features (HCC)       Donia Guiles, LCSW 07/25/2018

## 2018-07-25 NOTE — Progress Notes (Signed)
    Daily Group Progress Note  Program: IOP  Group Time: 9am-12pm  Participation Level: Active  Behavioral Response: Appropriate  Type of Therapy:  Group Therapy; psycho-educational group, process group  Summary of Progress:  The purpose of this group is to utilize CBT and DBT skills in a group setting to increase use of healthy coping skills and decrease frequency and intensity of active mental health symptoms.  9am-10:30am Clinician checked in with group members, assessing for SI/HI/psychosis and overall level of functioning. Clinician inquired about completed self-care activity from the previous day. Psycho-educational portion of group led by pharmacist. Pharmacist provided psychoeducation on classes of medications and symptoms addressed. Time was allowed for clients to ask questions of pharmacist.  10:30am-12pm Clinician presented the topic of [place] Mind with a task to improve focus. Clinician and group members discussed Emotional, Rational, and Wise mind, and the effect of decision making based on each. Clinician and group members discussed where each could be useful or harmful. Clinician presented the topic of maintaining focus, expanding on the content of what the group tended to think about at times of focus loss. Clinician reminded clients of CBT triangle and connection of thoughts, feelings, and behaviors. Clinician validated the universal tendency of individuals to mind wander and emphasized the importance of assessing their thoughts for thought traps and the amount of work necessary to reverse the effects of negative thoughts. Clinician presented strategies to maintain focus and facilitated discussion with clients about their successes and personal applicability. Clinician checked out requesting clients share a self-care activity planned for the day. Client has made progress in therapy as evidenced by her willingness to discuss her tendency to act upon derailing thoughts and  acknowledging the impact it has on completing pressing tasks. Client described her propensity toward an emotional mind and how this can impact her behavior particularly her ruminations. Client actively participated in group.    Harlon Ditty, LCSW

## 2018-07-26 ENCOUNTER — Other Ambulatory Visit: Payer: Self-pay

## 2018-07-26 ENCOUNTER — Other Ambulatory Visit (HOSPITAL_COMMUNITY): Payer: Self-pay

## 2018-07-26 ENCOUNTER — Other Ambulatory Visit (HOSPITAL_COMMUNITY): Payer: 59 | Admitting: Licensed Clinical Social Worker

## 2018-07-26 DIAGNOSIS — F332 Major depressive disorder, recurrent severe without psychotic features: Secondary | ICD-10-CM | POA: Diagnosis not present

## 2018-07-29 ENCOUNTER — Other Ambulatory Visit (HOSPITAL_COMMUNITY): Payer: 59 | Admitting: Licensed Clinical Social Worker

## 2018-07-29 ENCOUNTER — Other Ambulatory Visit: Payer: Self-pay

## 2018-07-29 DIAGNOSIS — F332 Major depressive disorder, recurrent severe without psychotic features: Secondary | ICD-10-CM | POA: Diagnosis not present

## 2018-07-29 NOTE — Progress Notes (Signed)
    Daily Group Progress Note  Program: IOP  Group Time: 9am-12pm  Participation Level: Active  Behavioral Response: Appropriate  Type of Therapy:  Group Therapy; process group, psycho-educational group  Summary of Progress:  The purpose of this group is to utilize CBT and DBT skills in a group setting to increase use of healthy coping skills and decrease frequency and intensity of active mental health symptoms.  9am-10:30am Clinician checked in with group members, assessing for SI/HI/psychosis. Clinician and group members discussed stressors and skills from the evening. Clinician actively listened to clients, utilizing summarizing and re-framing statements. Clinician and group members processed difficulties asking for help based on cultural background as well as existing stigmas. Clinician and group members processed options for finding understanding support systems.  10:30am-12pm Clinician presented Laughter Yoga to clients with psycho-education as well as in session activities. Clinician praised clients willingness to use mindfulness and engage in alternative forms of mindfulness. Clinician and group members discussed different intensities of feelings and vulnerability. Clinician and group members discussed positive affirmations and their level of belief based on previous experiences. Clinician concluded group with asking client to identify positive self care activity planned for the week. Client engaged in group discussions and activities. Client  Notes continuing to have a mindfulness object from the pebble meditation is helpful in stressful situations. Client is looking forward to working and maintaining a schedule which she finds helpful.  Harlon Ditty, LCSW

## 2018-07-30 ENCOUNTER — Other Ambulatory Visit: Payer: Self-pay

## 2018-07-30 ENCOUNTER — Other Ambulatory Visit (HOSPITAL_COMMUNITY): Payer: 59 | Admitting: Licensed Clinical Social Worker

## 2018-07-30 DIAGNOSIS — F332 Major depressive disorder, recurrent severe without psychotic features: Secondary | ICD-10-CM

## 2018-07-30 NOTE — Progress Notes (Signed)
    Daily Group Progress Note  Program: IOP  Group Time: 9am-12pm  Participation Level: Active  Behavioral Response: Appropriate  Type of Therapy:  Group Therapy; process group, psycho-educational group  Summary of Progress:  The purpose of this group is to utilize CBT and DBT skills in a group setting to increase use of healthy coping skills and decrease frequency and intensity of active mental health symptoms. 9am-10:30am Clinician checked in with group members, assessing for SI/HI/psychosis. Clinician and group members discussed stressors and skills from the weekend. Clinician actively listened to clients, utilizing summarizing and re-framing statements. Clinician and group members discussed motivation, and ways to improve motivation to get out of bed, even with limited desire to complete tasks. Clinician and group members processed the vulnerability that comes with asking for help, especially when it is a new skill. Group members processed reasons for seeking and accepting help at this time. Clinician lead mindful stretching exercise.  10:30am-12pm Clinician presented the topic of Growth Mindset. Clinician and group members discussed adapting core beliefs and focusing on avoiding black and white thinking. Clinician praised group members for identifying cognitive distortions they are actively working on addressing. Clinician and group members discussed the struggle being receptive to help after avoiding and minimizing symptoms. Clinician presented clients with positive affirmations, and requested that group members share an affirmation they believe to be true related to themselves. Client engaged in group discussions and activities. Client noted she practices the pebble meditation outside of group and finds it effective.   Harlon Ditty, LCSW

## 2018-07-30 NOTE — Psych (Signed)
   [facility] PHP THERAPIST PROGRESS NOTE  Taylor Oneill [id number]  Session Time: 9:00 - 11:00  Participation Level: Active  Behavioral Response: CasualAlertDepressed  Type of Therapy: Group Therapy  Treatment Goals addressed: Coping  Interventions: CBT, DBT, Solution Focused, Supportive and Reframing  Summary: Clinician led check-in regarding current stressors and situation, and review of patient completed daily inventory. Clinician utilized active listening and empathetic response and validated patient emotions. Clinician facilitated processing group on pertinent issues.   Therapist Response: Taylor Oneill is a 23 y.o. female who presents with depression symptoms. Patient arrived within time allowed and reports she is feeling bummed out and anxious". Patient rates her mood at a 7 on a scale of 1-10 with 10 being great. Pt. reports that "I am not sure why" she feels down, but she thinks it's because "I've got to get things done". Pt. states she set up a white board with her short- and long-term goals to keep her organized. Pt. states she didn't get everything she wanted to get done yesterday and it increased her anxiety. Pt. states she hung out and ate dinner with her brothers last night and she had a nice time. Patient continues to struggle with time management. Patient engaged in discussion.         Session Time: 11:00 -12:00  Participation Level:Active  Behavioral Response:CasualAlertDepressed  Type of Therapy: Group Therapy, OT  Treatment Goals addressed: Coping  Interventions:Psychosocial skills training, Supportive,   Summary:Occupational Therapy group  Therapist Response: Patient engaged in group. See OT note.        Session Time: 12:00- 12:45  Participation Level:Active  Behavioral Response:CasualAlertDepressed  Type of Therapy: Group Therapy  Treatment Goals addressed: Coping  Interventions:Social Skills  Training, Supportive  Summary:Reflection Group: Patients encouraged to practice skills and interpersonal techniques or work on mindfulness and relaxation techniques. The importance of self-care and making skills part of a routine to increase usage were stressed   Therapist Response: Patient engaged and participated appropriately.         Session Time: 12:45- 2:00  Participation Level:Active  Behavioral Response:CasualAlertDepressed  Type of Therapy: GroupTherapy, Psychoeducation, psychotherapy  Treatment Goals addressed: Coping  Interventions:CBT, DBT, solution focused, Supportive; Reframing  Summary: 12:45 - 1:50: Clinician provided education on how to set boundaries. Cln utilized handout "How to set a Boundary" and "Healthy Boundary setting." Pt's shared boundary issues and group discussed how to handle them.  1:50 -2:00 Clinician led check-out. Clinician assessed for immediate needs, medication compliance and efficacy, and safety concerns   Therapist Response: Patient engaged in activity. Pt reports understanding of how to set boundaries and is able to practice using a problem with her ex-boyfriend.  At check-out, patient rates her mood at a 8 on a scale of 1-10 with 10 being great. Patient reports afternoon plans to work on resume, make appointment for optometrist, get taillight fixed, and vote. Patient demonstrates some progress as evidenced by recognizing boundaries. Patient denies SI/HI/self-harm at the end of group.       Suicidal/Homicidal: Nowithout intent/plan   Plan: Pt will continue in PHP while working to decrease depression symptoms and increasing ability to manage symptoms in a healthy manner.   Diagnosis: Severe episode of recurrent major depressive disorder, without psychotic features (HCC) [F33.2]    1. Severe episode of recurrent major depressive disorder, without psychotic features (HCC)       Taylor Guiles, LCSW 07/30/2018

## 2018-07-31 ENCOUNTER — Other Ambulatory Visit (HOSPITAL_COMMUNITY): Payer: 59 | Admitting: Licensed Clinical Social Worker

## 2018-07-31 ENCOUNTER — Other Ambulatory Visit: Payer: Self-pay

## 2018-07-31 DIAGNOSIS — F332 Major depressive disorder, recurrent severe without psychotic features: Secondary | ICD-10-CM

## 2018-07-31 NOTE — Progress Notes (Signed)
    Daily Group Progress Note  Program: IOP  Group Time: 9am-12pm  Participation Level: Active  Behavioral Response: Appropriate  Type of Therapy: Group Therapy; process group, psycho-educational group Summary of Progress: The purpose of this group is to utilize CBT and DBT skills in a group setting to increase use of healthy coping skills and decrease frequency and intensity of active mental health symptoms.  9am-11am: Clinician provided psycho-educational information related to mindfulness and relationship between emotions and eating. Clinician and group members brainstormed options for other coping skills to use in place of eating, and the benefits of mindful eating. Clinician presented the skill of EFT or Tapping, focused on acknowledging problems and emotions, and showing self compassion and acceptance. Clinician and group members processed how they learned to identify, share, and cope with emotions growing up. Clinician and group members processed how what was modeled when they were younger had an effect on relationships with people and emotions as adults.  11am-12pm Co-facilitator for psycho-educational group from [facility]. Guest provided psycho-educational information on health and wellness in relation to mental health. Topics included sleep hygiene, healthy eating habits, and benefits to exercise.  Client reports tapping exercise is not helpful and she has tried it previously with her mother unsuccessfully. Client reports growing up she learned to cope with uncomfortable emotions from her mother, by drinking alcohol. Client reports being mindful of this now. Client endorsed some financial stressors related to work closing and having to contact her ex. Client is interested in beginning to add physical activity back into her life through exercising.  Harlon Ditty, LCSW

## 2018-07-31 NOTE — Psych (Signed)
[facility] PHP THERAPIST PROGRESS NOTE  Taylor Oneill [id number]  Session Time: 9:00 - 11:00  Participation Level: Active  Behavioral Response: CasualAlertDepressed  Type of Therapy: Group Therapy  Treatment Goals addressed: Coping  Interventions: CBT, DBT, Solution Focused, Supportive and Reframing  Summary: Clinician led check-in regarding current stressors and situation, and review of patient completed daily inventory. Clinician utilized active listening and empathetic response and validated patient emotions. Clinician facilitated processing group on pertinent issues.   Therapist Response:  Taylor Oneill is a 23 y.o. female who presents with depression symptoms. Patient arrived within time allowed and reports that she is feeling "a little off". Patient rates her mood at a 8 on a scale of 1-10 with 10 being great. Pt. reports "I had a hectic morning." Pt. states she had car trouble and managed with distraction skills to not freak out so she could manage the situation. Pt. states she is excited about her friend coming to visit from [place] and about her brother's birthday. Pt. states yesterday she went to vote, got cleaning done, washed sheets and watched a movie with her brother. Pt. states, "I amped up about today" and had a hard time falling asleep. Pt. reports sleeping well after taking her sleep medication. Patient continues to struggle with the need to stay busy. Patient engaged in discussion.        Session Time: 11:00 -12:15  Participation Level: Active  Behavioral Response: CasualAlertDepressed  Type of Therapy: Group Therapy, psychotherapy  Treatment Goals addressed: Coping  Interventions: Strengths based, reframing, Supportive,   Summary:  Spiritual Care group  Therapist Response: Patient engaged in group. See chaplain note.         Session Time: 12:15 - 1:00  Participation Level: Active  Behavioral Response: CasualAlertDepressed  Type  of Therapy: Group Therapy  Treatment Goals addressed: Coping  Interventions: Psychologist, occupational, Supportive  Summary:  Reflection Group: Patients encouraged to practice skills and interpersonal techniques or work on mindfulness and relaxation techniques. The importance of self-care and making skills part of a routine to increase usage were stressed   Therapist Response: Patient engaged and participated appropriately.        Session Time: 1:00- 2:00  Participation Level: Active  Behavioral Response: CasualAlertDepressed  Type of Therapy: Group Therapy, Psychoeducation  Treatment Goals addressed: Coping  Interventions: relaxation training; Supportive; Reframing  Summary: 12:45 - 1:50: Relaxation group: Cln led group focused on retraining the body's response to stress.   1:50 -2:00 Clinician led check-out. Clinician assessed for immediate needs, medication compliance and efficacy, and safety concerns   Therapist Response: Patient engaged in activity and discussion. At check-out, patient rates her mood at a 9 on a scale of 1-10 with 10 being great. Patient reports afternoon plans to visit with friends, hang out, and write a letter to an old friend. Patient demonstrates some progress as evidenced by communicating with friends and family about her needs. Patient denies SI/HI/self-harm thoughts at the end of group.       Suicidal/Homicidal: Nowithout intent/plan   Plan: Pt will discharge from PHP due to meeting treatment goals of decreased depression symptoms and increased ability to manage symptoms in a healthy manner. Pt will step down to IOP within this agency for further support. Pt will begin IOP on 3/5 at 9am. Pt denies SI/HI at time of discharge.   Diagnosis: Severe episode of recurrent major depressive disorder, without psychotic features (HCC) [F33.2]    1. Severe episode of recurrent major  depressive disorder, without psychotic features (HCC)       Donia Guiles, LCSW 07/31/2018

## 2018-08-01 ENCOUNTER — Other Ambulatory Visit: Payer: Self-pay

## 2018-08-01 ENCOUNTER — Encounter (HOSPITAL_COMMUNITY): Payer: Self-pay | Admitting: Family

## 2018-08-01 ENCOUNTER — Other Ambulatory Visit (HOSPITAL_COMMUNITY): Payer: 59

## 2018-08-01 ENCOUNTER — Other Ambulatory Visit (HOSPITAL_COMMUNITY): Payer: 59 | Admitting: Family

## 2018-08-01 DIAGNOSIS — F332 Major depressive disorder, recurrent severe without psychotic features: Secondary | ICD-10-CM

## 2018-08-01 NOTE — Progress Notes (Signed)
    Daily Group Progress Note  Program: IOP  Group Time: 9am-12pm  Participation Level: Active  Behavioral Response: Appropriate  Type of Therapy: Group Therapy; process group, psycho-educational group ? Summary of Progress:  The purpose of this group is to utilize CBT and DBT skills to increase use of healthy coping skills and decrease frequency and intensity of active mental health symptoms.  9am-10:30am Clinician checked in with group members, assessing for SI/HI/psychosis and overall level of functioning. Clinician inquired about completed self care activities and any skills practiced since last group. Clinician presented the topic of Healthy Boundaries. Clinician and group members discussed what boundaries means and common types of boundaries. Clinician and group members processed factors in setting and maintaining boundaries including relationship, situation, and previous response to boundaries. Clinician presented to group My Personal Bill of Rights and processed difficulty changing mindset to allow for healthy personal boundaries.  10:30am-12pm Clinician and clients discussed types of boundaries, and signs of healthy and unhealthy boundaries. Clinician and group members discussed how to define and maintain healthy boundaries with self, others and food, substances, or time. Clinician and group members processed struggle with codependency and feelings of low self worth and the effect on identifying and maintaining healthy boundaries. Clinician and group members role played in session identifying specific feelings related to a situation, the thoughts causing those feelings, and identifying alternative thoughts without previously learned cognitive distortions. Clinician requested group members check out with something they are grateful for and a planned self care activity for the day. Client engaged in discussions and provided examples of changes in boundaries with group members. Client  provided support for other group members and verbalized concerns about changes in job schedule so soon after starting.  Harlon Ditty, LCSW

## 2018-08-01 NOTE — Progress Notes (Signed)
Daily Group Progress Note  Program: IOP  Group Time: 9am-12pm  Participation Level: Active  Behavioral Response: Appropriate  Type of Therapy:  Group Therapy; process group, psycho educational skills group  Summary of Progress:  The purpose of this group is to utilize CBT and DBT skills in a group setting to increase use of healthy coping skills and decrease frequency and intensity of active mental health symptoms.  9am-11am Clinician checked in with group members, assessing for SI/HI/psychosis and overall level of functioning. Clinician inquired about stressors from the previous day and skills clients attempted to address these. Clinician and group processed frustration with wellness presentation from the previous day. Clinician and group members utilized Ungame prompts to process ongoing stressors, current needs, and brainstorm ways to get needs met. Clinician provided supportive listening, reflective statements and clarifying questions to maintain engagement. Clinician highlighted skills clients were using outside of group setting and provided praise.  11a-12 pm Psycho-educational and skills group co-facilitated by counselor focusing on using yoga and breathing as mindfulness skill to help manage daily symptoms.  Client engaged in all group discussions and activities. Client completed discharge planning with case manager, see note for additional details. Client reports overall improved mood however continuing adjustment period being home due to virus constraints. 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  Olegario Messier, LCSW

## 2018-08-01 NOTE — Progress Notes (Addendum)
Taylor Oneill is a 23 y.o. , single, unemployed, Caucasian female who transitioned from [facility].  As previous [facility] note from PHP states:   Pt presents as self referral due to increasing depression symptoms, SI, and self harm thoughts which necessitated withdrawal from college and coming home. Pt states she has been attending [employer] the past 3 years and over the past school year she has had a drastic decline in ability to concnertrate, decreased motivation, and increased depression and anxiety symptoms. Pt states that the school year prior she was on the Dean's List and then in Fall 2019 she failed 4 out of 6 of her classes and was "on track to do the same this semester." Pt states that she enjoys her classes she just cannot get herself to go to class or complete work. Pt reports poor/conflictual support; self medicating with substances; recent risky behaviors; and no recent treatment. Pt reports passive SI denying current plan or intent.  Pt completed MH-IOP today.  Pt attended all scheduled days.  Reports that the groups were helpful.  Although overall mood is improving; pt c/o feeling very wired.  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  Pt continues to reports passive SI; denies a plan or intent.  Also, denies HI or A/V hallucinations.  Admit PHQ-9 score was 18; Discharge 10. A:  Discharged pt today.  F/U with Hilbert Odor, LCSW on 08-05-18 @ 1:30 pm and Dr. Hinton Dyer on 08-07-18 @ 1pm.  Encouraged support groups.  R:  Patient receptive.        Chestine Spore, RITA, M.Ed,CNA

## 2018-08-01 NOTE — Progress Notes (Signed)
    Daily Group Progress Note  Program: IOP  Group Time: 9am-12pm  Participation Level: Active  Behavioral Response: Appropriate  Type of Therapy: Group Therapy; process group, psycho-educational skills group ? Summary of Progress:  The purpose of this group is to utilize CBT and DBT skills in a group setting to increase use of healthy coping skills and decrease frequency and intensity of active mental health symptoms.  9am-10:30am Clinician checked in with group members, assessing for SI/HI/psychosis and overall level of functioning. Clinician inquired about recent stressors and skills used in response. Clinician presented the topic of Anger Management. Clinician and group members processed anger as a secondary emotion and identified emotions which can often present as anger. Clinician and group members processed what messages clients received growing up about anger and acceptable ways to express anger. Clinician and group members discussed what other messages clients received about identifying and showing feelings from parents.  10:30am-12pm Clinician lead mindfulness activity for group members and inquired about overall effectiveness. Clinician and group members created tactile fidget which could be used as part of a sensory mindfulness activity in the community.  Client engaged in discussion and activities, identifying acceptable emotions to display growing up and how this effected how she shows feelings as an adult. Client identified not beng able to show emotions that made others uncomfortable and or suppressing emotions with alcohol.  Harlon Ditty, LCSW

## 2018-08-01 NOTE — Progress Notes (Signed)
  [facility] Health Intensive Outpatient Program Discharge Summary  Taylor Oneill [id number]  Admission date: 07/18/2018 Discharge date: 08/01/2018  Reason for admission: Depression with passive suicidal ideations   Per assessment note: Patient is a 23 y.o. Caucasian female presents with depression.  Patient was enrolled in partial psychiatric program on 07/03/18.Reports worsening depression since the break-up between she and her ex-boyfriend.  As she reports she started to feel codependent within the relationship.  Reports history with significant anxiety and seizure disorder.  States she has been followed by psychiatry however is currently not seen by psychiatrist or therapist.  States she is taking Lamictal for seizure disorder.  Patient reports anxiety exacerbates her seizure activity.    Chemical Use History: Reports intermittent THC use   Family of Origin Issues: Patient continue to report that her mother is supportive. Reported minimal contacted with her ex, however stated they share a phone plan and she had to initiated contact because she lost her new job due to the corona Virus.   Progress in Program Toward Treatment Goals: Lurdes attended and participated with daily group session with active and engaged participation.  Patient was initiated on Zoloft and Remeron while attending Partial hospitalization and Intensive outpatient programming however continues to be apprehensive and resistant to medication management at this time.  Reports taking trazodone 50 mg p.o. as needed. Wendye is currently denying suicidal or homicidal ideations.  Denies auditory or visual hallucinations.  Patient to keep follow-up with p attending psychiatrist.   Progress (rationale): Keep follow-up with LCSW - Delano Metz. and Psychiatrist - Pucilowski.  - 3/4 testing for ADHD testing- pending   Take all medications as prescribed. Keep all follow-up appointments as scheduled.  Do not consume  alcohol or use illegal drugs while on prescription medications. Report any adverse effects from your medications to your primary care provider promptly.  In the event of recurrent symptoms or worsening symptoms, call 911, a crisis hotline, or go to the nearest emergency department for evaluation.   Oneta Rack, NP 08/01/2018

## 2018-08-01 NOTE — Patient Instructions (Signed)
D:  Patient completed MH-IOP today.  A:  Discharge today.  Follow up with Hilbert Odor, LCSW on 08-05-18 @ 1:30 pm and Dr Hinton Dyer on 08-07-18 @ 1 pm.  Encouraged support groups.  R:  Patient receptive.

## 2018-08-02 ENCOUNTER — Other Ambulatory Visit (HOSPITAL_COMMUNITY): Payer: 59

## 2018-08-05 ENCOUNTER — Encounter (HOSPITAL_COMMUNITY): Payer: Self-pay | Admitting: Psychiatry

## 2018-08-05 ENCOUNTER — Other Ambulatory Visit (HOSPITAL_COMMUNITY): Payer: 59

## 2018-08-05 ENCOUNTER — Other Ambulatory Visit (HOSPITAL_COMMUNITY): Payer: Self-pay | Admitting: Psychiatry

## 2018-08-05 ENCOUNTER — Other Ambulatory Visit: Payer: Self-pay

## 2018-08-05 ENCOUNTER — Ambulatory Visit (INDEPENDENT_AMBULATORY_CARE_PROVIDER_SITE_OTHER): Payer: 59 | Admitting: Psychiatry

## 2018-08-05 DIAGNOSIS — R4589 Other symptoms and signs involving emotional state: Secondary | ICD-10-CM | POA: Diagnosis not present

## 2018-08-05 DIAGNOSIS — F332 Major depressive disorder, recurrent severe without psychotic features: Secondary | ICD-10-CM

## 2018-08-05 DIAGNOSIS — F070 Personality change due to known physiological condition: Secondary | ICD-10-CM | POA: Diagnosis not present

## 2018-08-05 NOTE — Progress Notes (Signed)
Client: [name] "[name]" Sumpter  Date: 08/05/18  Time: 1:30-2:25  Type of Therapy: Individual Therapy  Axis I/II Diagnosis:? Major Depressive Disorder, Severe; Organic Personality Disorder; Difficulty Coping  Treatment goals addressed: Pt reports "I want to feel normal again."  Interventions: CBT, Motivational Interviewing, Psychoeducation, Coping Skill Building  Summary: Client [name], 23yo female who presents with MDD, Organic Personality DO and Difficulty Coping, due to adverse childhood experiences and stage of life issues. Counselor using therapeutic interventions to address depression and anxiety and to prepare for regaining independence, finishing college career and establishing healthy relationships.  Therapist Response: [name] met with Counselor for individual therapy. Counselor joined with [name] as she shared about takeaways from group, relationship status, living situation, financial situation, education status and plans for the future. Counselor assessed current psychiatric symptoms and life stressors with [name]. [name] reported that she is dealing with more anxiety due to the uncertainty of her life situation and the global impacts of COVID-19. She is experiencing more anxiety than depression. Counselor assessed healthy coping skill utilization. Counselor processed root cause of her mental health symptoms/issues. Counselor provided psychoeducation of trauma responses and the therapy process. Counselor prompted [name] to share ways she can take smaller steps towards her larger goals. Counselor explored her support system/social network to connect with healthy individuals. [name] shared about her childhood, belief and faith base and current efforts to create new relationships. Counselor extended telehealth to [facility], as a back-up to in person sessions. We plan to meet again in two weeks to follow up on mental health status.  Suicidal/Homicidal: No current safety concerns. No plan/intent to  harm self or others.  Plan: To return in 1 week. Will apply skills learned in session at home, until next session.  ?  Lise Auer, LCSW

## 2018-08-06 ENCOUNTER — Other Ambulatory Visit (HOSPITAL_COMMUNITY): Payer: 59

## 2018-08-07 ENCOUNTER — Other Ambulatory Visit: Payer: Self-pay

## 2018-08-07 ENCOUNTER — Other Ambulatory Visit (HOSPITAL_COMMUNITY): Payer: 59

## 2018-08-07 ENCOUNTER — Ambulatory Visit (INDEPENDENT_AMBULATORY_CARE_PROVIDER_SITE_OTHER): Payer: 59 | Admitting: Psychiatry

## 2018-08-07 ENCOUNTER — Encounter (HOSPITAL_COMMUNITY): Payer: Self-pay | Admitting: Psychiatry

## 2018-08-07 VITALS — BP 145/97 | HR 82 | Temp 98.0°F | Ht 67.0 in | Wt 179.0 lb

## 2018-08-07 DIAGNOSIS — F410 Panic disorder [episodic paroxysmal anxiety] without agoraphobia: Secondary | ICD-10-CM

## 2018-08-07 DIAGNOSIS — F332 Major depressive disorder, recurrent severe without psychotic features: Secondary | ICD-10-CM

## 2018-08-07 HISTORY — DX: Panic disorder (episodic paroxysmal anxiety): F41.0

## 2018-08-07 MED ORDER — TRAZODONE HCL 50 MG PO TABS: 50.0000 mg | ORAL_TABLET | Freq: Every evening | ORAL | 0 refills | Status: DC | PRN

## 2018-08-07 MED ORDER — DULOXETINE HCL 30 MG PO CPEP: 30.0000 mg | ORAL_CAPSULE | Freq: Every day | ORAL | 0 refills | Status: DC

## 2018-08-07 MED ORDER — CLONAZEPAM 0.5 MG PO TABS
0.5000 mg | ORAL_TABLET | Freq: Two times a day (BID) | ORAL | 0 refills | Status: DC | PRN
Start: 2018-08-07 — End: 2018-09-04
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
## 2018-08-08 ENCOUNTER — Other Ambulatory Visit (HOSPITAL_COMMUNITY): Payer: 59

## 2018-08-09 ENCOUNTER — Other Ambulatory Visit (HOSPITAL_COMMUNITY): Payer: 59

## 2018-08-12 ENCOUNTER — Other Ambulatory Visit (HOSPITAL_COMMUNITY): Payer: 59

## 2018-08-13 ENCOUNTER — Encounter (HOSPITAL_COMMUNITY): Payer: Self-pay | Admitting: Psychiatry

## 2018-08-13 ENCOUNTER — Ambulatory Visit (INDEPENDENT_AMBULATORY_CARE_PROVIDER_SITE_OTHER): Payer: 59 | Admitting: Psychiatry

## 2018-08-13 ENCOUNTER — Other Ambulatory Visit: Payer: Self-pay

## 2018-08-13 ENCOUNTER — Other Ambulatory Visit (HOSPITAL_COMMUNITY): Payer: 59

## 2018-08-13 ENCOUNTER — Ambulatory Visit (HOSPITAL_COMMUNITY): Payer: 59 | Admitting: Psychiatry

## 2018-08-13 DIAGNOSIS — F332 Major depressive disorder, recurrent severe without psychotic features: Secondary | ICD-10-CM | POA: Diagnosis not present

## 2018-08-13 DIAGNOSIS — F070 Personality change due to known physiological condition: Secondary | ICD-10-CM

## 2018-08-13 DIAGNOSIS — R4589 Other symptoms and signs involving emotional state: Secondary | ICD-10-CM

## 2018-08-13 DIAGNOSIS — F410 Panic disorder [episodic paroxysmal anxiety] without agoraphobia: Secondary | ICD-10-CM

## 2018-08-13 NOTE — Progress Notes (Signed)
Client: [name] "Taylor Oneill" Focht  Date: 08/13/18  Time: 12:35-1:30  Type of Therapy: Individual Therapy  Axis I/II Diagnosis:? Major Depressive Disorder, Severe; Organic Personality Disorder; Difficulty Coping  Treatment goals addressed: Pt reports "I want to feel normal again."  Interventions: CBT, Motivational Interviewing, Psychoeducation, Coping Skill Building  Summary: Client Taylor Oneill, 23yo female who presents with MDD, Organic Personality DO and Difficulty Coping, due to adverse childhood experiences and stage of life issues. Counselor using therapeutic interventions to address depression and anxiety and to prepare for regaining independence, finishing college career and establishing healthy relationships.  Therapist Response: Taylor Oneill met with Counselor for individual therapy. Counselor joined with Taylor Oneill as she shared about her need for a new routine, progress on her housing goals, communications with her ex, concerns about her mother and her growing connection with her brother. Counselor assessed current psychiatric symptoms and life stressors with Taylor Oneill. Taylor Oneill expressed concerns about her drinking habits, and her increase in negative thoughts. Counselor assessed negative intrusive thoughts and processed helpful ways to combat them. Counselor praised Taylor Oneill for her routine of keeping up with the days since she last self-harmed (2014). Counselor explored Taylor Oneill's family dynamics and her role within the family. Taylor Oneill opened up about family mental health and substance abuse issues and her role for each family member. Counselor assessed Taylor Oneill's alcohol consumption and behaviors around her drinking. Counselor and Taylor Oneill explored the status of her relationship with her ex and how to communicate clearly and effectively through CBT skills. Counselor reminded Taylor Oneill of healthy coping skills and strategies and promoted self-care and boundaries.  Suicidal/Homicidal: No current safety concerns. No plan/intent  to harm self or others.  Plan: To return in 1 week. Will apply skills learned in session at home, until next session.  ?  Lise Auer, LCSW

## 2018-08-14 ENCOUNTER — Other Ambulatory Visit (HOSPITAL_COMMUNITY): Payer: 59

## 2018-08-15 ENCOUNTER — Other Ambulatory Visit (HOSPITAL_COMMUNITY): Payer: 59

## 2018-08-16 ENCOUNTER — Other Ambulatory Visit (HOSPITAL_COMMUNITY): Payer: 59

## 2018-08-19 ENCOUNTER — Other Ambulatory Visit (HOSPITAL_COMMUNITY): Payer: 59

## 2018-08-20 ENCOUNTER — Encounter (HOSPITAL_COMMUNITY): Payer: Self-pay | Admitting: Psychiatry

## 2018-08-20 ENCOUNTER — Other Ambulatory Visit (HOSPITAL_COMMUNITY): Payer: 59

## 2018-08-20 ENCOUNTER — Ambulatory Visit (INDEPENDENT_AMBULATORY_CARE_PROVIDER_SITE_OTHER): Payer: 59 | Admitting: Psychiatry

## 2018-08-20 ENCOUNTER — Other Ambulatory Visit: Payer: Self-pay

## 2018-08-20 DIAGNOSIS — F332 Major depressive disorder, recurrent severe without psychotic features: Secondary | ICD-10-CM

## 2018-08-20 DIAGNOSIS — F410 Panic disorder [episodic paroxysmal anxiety] without agoraphobia: Secondary | ICD-10-CM

## 2018-08-20 DIAGNOSIS — R4589 Other symptoms and signs involving emotional state: Secondary | ICD-10-CM

## 2018-08-20 NOTE — Progress Notes (Signed)
Virtual Visit via Telephone Note  I connected with Taylor Oneill on 08/20/18 at 11:00 AM EDT by telephone and verified that I am speaking with the correct person using two identifiers.   I discussed the limitations, risks, security and privacy concerns of performing an evaluation and management service by telephone and the availability of in person appointments. I also discussed with the patient that there may be a patient responsible charge related to this service. The patient expressed understanding and agreed to proceed.   History of Present Illness: MDD, Panic Disorder and difficulty coping due to a toxic relationship and stage of life issues.   Observations/Objective: Counselor met with Taylor Oneill via telephone for individual therapy. Counselor assessed for psychiatric symptoms and life stressors. Counselor praised Taylor Oneill for her productivity and healthy connection making she has done since we last spoke. Taylor Oneill presented as hopeful and optimistic. Counselor processed relationship dynamics between her and family members. Taylor Oneill reported on progress with her families understanding of her mental health needs. Counselor assessed coping strategies and safety. Taylor Oneill denied any self-harm or SI, and is actively working to keep safe during [employer]. Counselor processed progress on goals and discussed treatment plan moving forward.   Assessment and Plan: Taylor Oneill understood that moving to telehealth was for health and safety reasons and she is open to continuing via phone. We will meet in 1 week, but will then more to EOW.  Follow Up Instructions: Counselor will contact Taylor Oneill for next session. Taylor Oneill will continue to implement healthy coping strategies.    I discussed the assessment and treatment plan with the patient. The patient was provided an opportunity to ask questions and all were answered. The patient agreed with the plan and demonstrated an understanding of the instructions.    The patient was advised to call back or seek an in-person evaluation if the symptoms worsen or if the condition fails to improve as anticipated.  I provided 55 minutes of non-face-to-face time during this encounter.   Lise Auer, LCSW

## 2018-08-21 ENCOUNTER — Other Ambulatory Visit (HOSPITAL_COMMUNITY): Payer: 59

## 2018-08-22 ENCOUNTER — Other Ambulatory Visit (HOSPITAL_COMMUNITY): Payer: 59

## 2018-08-23 ENCOUNTER — Other Ambulatory Visit (HOSPITAL_COMMUNITY): Payer: 59

## 2018-08-26 ENCOUNTER — Other Ambulatory Visit (HOSPITAL_COMMUNITY): Payer: 59

## 2018-08-27 ENCOUNTER — Encounter (HOSPITAL_COMMUNITY): Payer: Self-pay | Admitting: Psychiatry

## 2018-08-27 ENCOUNTER — Ambulatory Visit (INDEPENDENT_AMBULATORY_CARE_PROVIDER_SITE_OTHER): Payer: 59 | Admitting: Psychiatry

## 2018-08-27 ENCOUNTER — Other Ambulatory Visit: Payer: Self-pay

## 2018-08-27 ENCOUNTER — Other Ambulatory Visit (HOSPITAL_COMMUNITY): Payer: 59

## 2018-08-27 DIAGNOSIS — F410 Panic disorder [episodic paroxysmal anxiety] without agoraphobia: Secondary | ICD-10-CM | POA: Diagnosis not present

## 2018-08-27 DIAGNOSIS — R4589 Other symptoms and signs involving emotional state: Secondary | ICD-10-CM

## 2018-08-27 DIAGNOSIS — F332 Major depressive disorder, recurrent severe without psychotic features: Secondary | ICD-10-CM | POA: Diagnosis not present

## 2018-08-27 NOTE — Progress Notes (Signed)
Virtual Visit via Telephone Note  I connected with Taylor Oneill on 08/27/18 at 12:30 PM EDT by telephone and verified that I am speaking with the correct person using two identifiers.   I discussed the limitations, risks, security and privacy concerns of performing an evaluation and management service by telephone and the availability of in person appointments. I also discussed with the patient that there may be a patient responsible charge related to this service. The patient expressed understanding and agreed to proceed.   History of Present Illness: MDD, Panic Disorder and Difficulty coping due to recent break up with long time relationship, moving back home and family conflict.    Observations/Objective: Counselor met with Taylor Oneill via telephone for individual therapy. Counselor assessed mental health status and symptoms. Taylor Oneill reported that overall she was stable this week despite some family conflict that was distressing to her. Counselor processed the situation and praised Taylor Oneill for using healthy coping stratagies to handle her emotions. Counselor explored short term goals and projects Taylor Oneill can start work on towards her longer term goals. Counselor promoted boundaries, communication and self-care.   Assessment and Plan: Counselor assigned CBT homework and we plan to meet next week to continue addressing treatment plan goals.   Follow Up Instructions: Counselor will contact Taylor Oneill at the time of session via phone.    I discussed the assessment and treatment plan with the patient. The patient was provided an opportunity to ask questions and all were answered. The patient agreed with the plan and demonstrated an understanding of the instructions.   The patient was advised to call back or seek an in-person evaluation if the symptoms worsen or if the condition fails to improve as anticipated.  I provided 48 minutes of non-face-to-face time during this encounter.   Lise Auer,  LCSW

## 2018-08-28 ENCOUNTER — Other Ambulatory Visit (HOSPITAL_COMMUNITY): Payer: 59

## 2018-08-29 ENCOUNTER — Other Ambulatory Visit (HOSPITAL_COMMUNITY): Payer: 59

## 2018-08-30 ENCOUNTER — Other Ambulatory Visit (HOSPITAL_COMMUNITY): Payer: 59

## 2018-09-02 ENCOUNTER — Other Ambulatory Visit (HOSPITAL_COMMUNITY): Payer: 59

## 2018-09-03 ENCOUNTER — Other Ambulatory Visit: Payer: Self-pay

## 2018-09-03 ENCOUNTER — Encounter (HOSPITAL_COMMUNITY): Payer: Self-pay | Admitting: Psychiatry

## 2018-09-03 ENCOUNTER — Ambulatory Visit (INDEPENDENT_AMBULATORY_CARE_PROVIDER_SITE_OTHER): Payer: 59 | Admitting: Psychiatry

## 2018-09-03 ENCOUNTER — Other Ambulatory Visit (HOSPITAL_COMMUNITY): Payer: 59

## 2018-09-03 DIAGNOSIS — F3342 Major depressive disorder, recurrent, in full remission: Secondary | ICD-10-CM | POA: Diagnosis not present

## 2018-09-03 DIAGNOSIS — F410 Panic disorder [episodic paroxysmal anxiety] without agoraphobia: Secondary | ICD-10-CM

## 2018-09-03 DIAGNOSIS — R4589 Other symptoms and signs involving emotional state: Secondary | ICD-10-CM | POA: Diagnosis not present

## 2018-09-03 NOTE — Progress Notes (Signed)
Virtual Visit via Telephone Note  I connected with Taylor Oneill on 09/03/18 at  1:30 PM EDT by telephone and verified that I am speaking with the correct person using two identifiers.   I discussed the limitations, risks, security and privacy concerns of performing an evaluation and management service by telephone and the availability of in person appointments. I also discussed with the patient that there may be a patient responsible charge related to this service. The patient expressed understanding and agreed to proceed.   History of Present Illness: Recurrent MDD, in full remission, panic disorder and difficulty coping due to stage of life stressors, recent break up  and familial issues.    Observations/Objective: Counselor met with Taylor Oneill for individual therapy via telephone. Counselor assessed mental health symptoms. Taylor Oneill reported that she has not experienced high anxiety or depression. She reports utilization of coping skills to be more mindful, intentional, less impulsive and communicating with others effectively. Counselor highlighted growth areas and praised her for her ability to manage mental health symptoms and navigate difficult relationships. Counselor assessed substance use, with Taylor Oneill reporting that she knows she is drinking more often than she she should. Counselor and Taylor Oneill processed intake and strategies in cutting down. Taylor Oneill reported that she would also like to increase her food intake, as she is forgetting or neglecting to eat meals throughout the day. Counselor explored her history with food and we discussed strategies to increase healthy food intake. Counselor provided support around a difficult conversation she is planning to have later today, to empower her to be prepared emotionally going into the interaction. Counselor and Taylor Oneill discussed progress made on goals and assessed need for treatment. Counselor praised her for progress.   Assessment and Plan: Counselor  and Taylor Oneill discussed moving sessions to every other week after our next session due to progress made in treatment. Taylor Oneill will continue journaling and using treatment skills to deal with life's stressors. She will continue to take medication as prescribed.   Follow Up Instructions: Counselor will call Taylor Oneill at the time of our next session. She is set to meet with Dr. Montel Culver tomorrow for med management.    I discussed the assessment and treatment plan with the patient. The patient was provided an opportunity to ask questions and all were answered. The patient agreed with the plan and demonstrated an understanding of the instructions.   The patient was advised to call back or seek an in-person evaluation if the symptoms worsen or if the condition fails to improve as anticipated.  I provided 40 minutes of non-face-to-face time during this encounter.   Lise Auer, LCSW

## 2018-09-04 ENCOUNTER — Other Ambulatory Visit (HOSPITAL_COMMUNITY): Payer: 59

## 2018-09-04 ENCOUNTER — Ambulatory Visit (INDEPENDENT_AMBULATORY_CARE_PROVIDER_SITE_OTHER): Payer: 59 | Admitting: Psychiatry

## 2018-09-04 ENCOUNTER — Other Ambulatory Visit: Payer: Self-pay

## 2018-09-04 DIAGNOSIS — F331 Major depressive disorder, recurrent, moderate: Secondary | ICD-10-CM

## 2018-09-04 DIAGNOSIS — F410 Panic disorder [episodic paroxysmal anxiety] without agoraphobia: Secondary | ICD-10-CM | POA: Diagnosis not present

## 2018-09-04 MED ORDER — CLONAZEPAM 0.5 MG PO TABS: 0.5000 mg | ORAL_TABLET | Freq: Two times a day (BID) | ORAL | 1 refills | Status: DC | PRN

## 2018-09-04 MED ORDER — DULOXETINE HCL 60 MG PO CPEP: 60.0000 mg | ORAL_CAPSULE | Freq: Every day | ORAL | 1 refills | Status: DC
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
## 2018-09-05 ENCOUNTER — Other Ambulatory Visit (HOSPITAL_COMMUNITY): Payer: 59

## 2018-09-06 ENCOUNTER — Other Ambulatory Visit (HOSPITAL_COMMUNITY): Payer: 59

## 2018-09-09 ENCOUNTER — Other Ambulatory Visit (HOSPITAL_COMMUNITY): Payer: 59

## 2018-09-10 ENCOUNTER — Other Ambulatory Visit: Payer: Self-pay

## 2018-09-10 ENCOUNTER — Other Ambulatory Visit (HOSPITAL_COMMUNITY): Payer: 59

## 2018-09-10 ENCOUNTER — Ambulatory Visit (INDEPENDENT_AMBULATORY_CARE_PROVIDER_SITE_OTHER): Payer: 59 | Admitting: Psychiatry

## 2018-09-10 ENCOUNTER — Encounter (HOSPITAL_COMMUNITY): Payer: Self-pay | Admitting: Psychiatry

## 2018-09-10 DIAGNOSIS — F331 Major depressive disorder, recurrent, moderate: Secondary | ICD-10-CM

## 2018-09-10 DIAGNOSIS — F410 Panic disorder [episodic paroxysmal anxiety] without agoraphobia: Secondary | ICD-10-CM

## 2018-09-10 DIAGNOSIS — R4589 Other symptoms and signs involving emotional state: Secondary | ICD-10-CM

## 2018-09-10 NOTE — Progress Notes (Signed)
Virtual Visit via Telephone Note  I connected with [name] on 09/10/18 at 12:30 PM EDT by telephone and verified that I am speaking with the correct person using two identifiers. [name] reports that her Internet does not work because she is currently living in a camper, so she has declined [employer].    I discussed the limitations, risks, security and privacy concerns of performing an evaluation and management service by telephone and the availability of in person appointments. I also discussed with the patient that there may be a patient responsible charge related to this service. The patient expressed understanding and agreed to proceed.   History of Present Illness: MDD, Panic Disorder and difficulty coping due to adverse life experiences, stage of life issues, familial conflict, recent break up and move, and financial/housing issues.    Observations/Objective: Counselor met with [name] over the phone for individual therapy. Counselor assessed progress on treatment plan goals and mental health symptoms. [name] shared that she needed to call the crisis line once over the past week and she reported that it was helpful. Counselor processed the situation that caused her to need the crisisline and praised her for communicating her needs and setting boundaries with the parties involved. Counselor discussed utilization of coping skills and her ability to access crisis services in the future. Counselor summarized the session and we planned to process more current relational issues next session.   Assessment and Plan: Counselor will continue to meet with [name] on an every other week basis since she is making progress on goals. [name] will follow safety plan and continue implementing coping skills.   Follow Up Instructions: Counselor will call [name] at next session.    I discussed the assessment and treatment plan with the patient. The patient was provided an opportunity to ask  questions and all were answered. The patient agreed with the plan and demonstrated an understanding of the instructions.   The patient was advised to call back or seek an in-person evaluation if the symptoms worsen or if the condition fails to improve as anticipated.  I provided 30 minutes of non-face-to-face time during this encounter.   Lise Auer, LCSW

## 2018-09-11 ENCOUNTER — Other Ambulatory Visit (HOSPITAL_COMMUNITY): Payer: 59

## 2018-09-24 ENCOUNTER — Other Ambulatory Visit: Payer: Self-pay

## 2018-09-24 ENCOUNTER — Other Ambulatory Visit (HOSPITAL_COMMUNITY): Payer: Self-pay

## 2018-09-24 ENCOUNTER — Ambulatory Visit (INDEPENDENT_AMBULATORY_CARE_PROVIDER_SITE_OTHER): Payer: 59 | Admitting: Psychiatry

## 2018-09-24 ENCOUNTER — Encounter (HOSPITAL_COMMUNITY): Payer: Self-pay | Admitting: Psychiatry

## 2018-09-24 DIAGNOSIS — F410 Panic disorder [episodic paroxysmal anxiety] without agoraphobia: Secondary | ICD-10-CM

## 2018-09-24 DIAGNOSIS — F331 Major depressive disorder, recurrent, moderate: Secondary | ICD-10-CM

## 2018-09-24 DIAGNOSIS — R4589 Other symptoms and signs involving emotional state: Secondary | ICD-10-CM

## 2018-09-24 MED ORDER — DULOXETINE HCL 60 MG PO CPEP: 60.0000 mg | ORAL_CAPSULE | Freq: Every day | ORAL | 0 refills | Status: DC

## 2018-09-24 NOTE — Progress Notes (Signed)
Virtual Visit via Telephone Note  I connected with Taylor Oneill on 09/24/18 at  1:30 PM EDT by telephone and verified that I am speaking with the correct person using two identifiers.  Location: Patient: Taylor Oneill Provider: Bethany Morris, LCSW   I discussed the limitations, risks, security and privacy concerns of performing an evaluation and management service by telephone and the availability of in person appointments. I also discussed with the patient that there may be a patient responsible charge related to this service. The patient expressed understanding and agreed to proceed.   History of Present Illness: MDD, Panic Disorder, and Difficulty Coping due to stage of life issues, family history of mental illness and adverse life experiences.    Observations/Objective: Counselor met with Taylor Oneill for individual therapy via Webex. Counselor assessed MH symptoms and progress on treatment plan goals. Taylor Oneill shared that "things are actually going really well in an excited voice. Taylor Oneill shared that she has entered a new relationship, that she was able to set ground rules, boundaries and expectations before the relationship started. Counselor explored context of the relationship and how it will impact her current situation and emotions. Cousnelor processed family stressors, mainly her relationship with her mother. Taylor Oneill shared that their have been highs and lows, but that she sees progress in her ability to express her needs and remove herself from toxic situations. Counselor praised Taylor Oneill for her ability to apply skills learned in treatment to daily life and for the "adulting" she has achieved in taking care of medical and transportation needs on her own. Taylor Oneill denied suicidal ideation or self-harm.  Assessment and Plan: Counselor will continue to meet with Taylor Oneill to address treatment plan goals. Taylor Oneill will continue to follow recommendations of providers and implement skills learned in  session.  Follow Up Instructions: Counselor will send information for next session via Webex.     I discussed the assessment and treatment plan with the patient. The patient was provided an opportunity to ask questions and all were answered. The patient agreed with the plan and demonstrated an understanding of the instructions.   The patient was advised to call back or seek an in-person evaluation if the symptoms worsen or if the condition fails to improve as anticipated.  I provided 57 minutes of non-face-to-face time during this encounter.   Bethany Morris, LCSW  

## 2018-10-08 ENCOUNTER — Ambulatory Visit (HOSPITAL_COMMUNITY): Payer: 59 | Admitting: Psychiatry

## 2018-10-08 ENCOUNTER — Other Ambulatory Visit: Payer: Self-pay

## 2018-10-17 ENCOUNTER — Other Ambulatory Visit: Payer: Self-pay

## 2018-10-17 ENCOUNTER — Ambulatory Visit (INDEPENDENT_AMBULATORY_CARE_PROVIDER_SITE_OTHER): Payer: 59 | Admitting: Psychiatry

## 2018-10-17 DIAGNOSIS — F331 Major depressive disorder, recurrent, moderate: Secondary | ICD-10-CM | POA: Diagnosis not present

## 2018-10-17 DIAGNOSIS — F410 Panic disorder [episodic paroxysmal anxiety] without agoraphobia: Secondary | ICD-10-CM

## 2018-10-17 MED ORDER — CLONAZEPAM 0.5 MG PO TABS: 0.5000 mg | ORAL_TABLET | Freq: Every day | ORAL | 1 refills | Status: DC | PRN

## 2018-10-17 MED ORDER — TRAZODONE HCL 50 MG PO TABS: 50.0000 mg | ORAL_TABLET | Freq: Every evening | ORAL | 0 refills | Status: DC | PRN

## 2018-10-17 MED ORDER — DULOXETINE HCL 30 MG PO CPEP: 90.0000 mg | ORAL_CAPSULE | Freq: Every day | ORAL | 1 refills | Status: DC
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
## 2018-10-22 ENCOUNTER — Ambulatory Visit (INDEPENDENT_AMBULATORY_CARE_PROVIDER_SITE_OTHER): Payer: 59 | Admitting: Psychiatry

## 2018-10-22 ENCOUNTER — Other Ambulatory Visit: Payer: Self-pay

## 2018-10-22 DIAGNOSIS — F331 Major depressive disorder, recurrent, moderate: Secondary | ICD-10-CM | POA: Diagnosis not present

## 2018-10-22 DIAGNOSIS — F410 Panic disorder [episodic paroxysmal anxiety] without agoraphobia: Secondary | ICD-10-CM | POA: Diagnosis not present

## 2018-10-22 NOTE — Progress Notes (Signed)
Virtual Visit via Telephone Note  I connected with Taylor Oneill on 10/22/18 at  1:00 PM EDT by telephone and verified that I am speaking with the correct person using two identifiers.  Location: Patient: Taylor Oneill  Provider: Lise Auer, LCSW    I discussed the limitations, risks, security and privacy concerns of performing an evaluation and management service by telephone and the availability of in person appointments. I also discussed with the patient that there may be a patient responsible charge related to this service. The patient expressed understanding and agreed to proceed.   History of Present Illness: Moderate MDD and Panic Disorder due to adverse life experiences.    Observations/Objective: Counselor met with Taylor Oneill for individual therapy via Webex. Counselor assessed MH symptoms and progress on treatment plan goals. Taylor Oneill denied suicidal ideation or self-harm behaviors. Taylor Oneill shared that since our last session she has had many life changes that has impacted her mental health and allowed her to use many of her coping skills. Taylor Oneill shared that she had experiences a break up and reengaged communication with her long time ex. Counsleor and Taylor Oneill processed actions and feelings around these situations. Counselor focused on effective communication and boundary setting, with Taylor Oneill giving examples of how she has done so. Counselor put into prospective all the work she has put into her [place] and discussed ways to sustain progress.   Assessment and Plan: Counselor will continue to meet with Taylor Oneill to address treatment plan goals. Taylor Oneill will continue to follow recommendations of providers and implement skills learned in session.  Follow Up Instructions: Counselor will send information for next session via Webex.     I discussed the assessment and treatment plan with the patient. The patient was provided an opportunity to ask questions and all were answered. The  patient agreed with the plan and demonstrated an understanding of the instructions.   The patient was advised to call back or seek an in-person evaluation if the symptoms worsen or if the condition fails to improve as anticipated.  I provided 55 minutes of non-face-to-face time during this encounter.   Lise Auer, LCSW

## 2018-10-23 ENCOUNTER — Encounter (HOSPITAL_COMMUNITY): Payer: Self-pay | Admitting: Psychiatry

## 2018-11-05 ENCOUNTER — Ambulatory Visit (HOSPITAL_COMMUNITY): Payer: 59 | Admitting: Psychiatry

## 2018-11-13 ENCOUNTER — Ambulatory Visit (INDEPENDENT_AMBULATORY_CARE_PROVIDER_SITE_OTHER): Payer: 59 | Admitting: Psychiatry

## 2018-11-13 ENCOUNTER — Other Ambulatory Visit: Payer: Self-pay

## 2018-11-13 DIAGNOSIS — R4589 Other symptoms and signs involving emotional state: Secondary | ICD-10-CM

## 2018-11-13 DIAGNOSIS — F331 Major depressive disorder, recurrent, moderate: Secondary | ICD-10-CM | POA: Diagnosis not present

## 2018-11-13 DIAGNOSIS — F410 Panic disorder [episodic paroxysmal anxiety] without agoraphobia: Secondary | ICD-10-CM

## 2018-11-13 NOTE — Progress Notes (Signed)
Virtual Visit via Video Note  I connected with Taylor Oneill on 11/13/18 at  4:00 PM EDT by a video enabled telemedicine application and verified that I am speaking with the correct person using two identifiers.  Location: Patient: Taylor Oneill Provider: Lise Auer, LCSW   I discussed the limitations of evaluation and management by telemedicine and the availability of in person appointments. The patient expressed understanding and agreed to proceed.  History of Present Illness: MDD, Panic Disorder and difficulty coping.    Observations/Objective: Counselor met with Taylor Oneill for individual therapy via Webex. Counselor assessed MH symptoms and progress on treatment plan goals. Taylor Oneill denied suicidal ideation or self-harm behaviors. Taylor Oneill shared that many things are going well for her in life right now. She reported a couple disappointing nights per week that cause her to feel down, but she is able to rationalize and process her emotions to become stable again. Taylor Oneill reported starting back to work, increasing income, looking for an apartment, making new friends, and helping her family out with an upcoming wedding. Counselor and Taylor Oneill explored her relationship dynamics with her mother, step father and father-related to her stage of development. Counselor and Taylor Oneill practiced communication skills and boundary setting. Counselor prompted Taylor Oneill to reflect on progress over the past 6 months and Taylor Oneill was able to identify growth areas.    Assessment and Plan: Counselor will continue to meet with Taylor Oneill to address treatment plan goals. Taylor Oneill will continue to follow recommendations of providers and implement skills learned in session.  Follow Up Instructions: Counselor will send information for next session via Webex.     I discussed the assessment and treatment plan with the patient. The patient was provided an opportunity to ask questions and all were answered. The patient agreed with  the plan and demonstrated an understanding of the instructions.   The patient was advised to call back or seek an in-person evaluation if the symptoms worsen or if the condition fails to improve as anticipated.  I provided 55 minutes of non-face-to-face time during this encounter.   Lise Auer, LCSW

## 2018-11-14 ENCOUNTER — Encounter (HOSPITAL_COMMUNITY): Payer: Self-pay | Admitting: Psychiatry

## 2018-11-18 ENCOUNTER — Other Ambulatory Visit: Payer: Self-pay

## 2018-11-18 ENCOUNTER — Ambulatory Visit (HOSPITAL_COMMUNITY): Payer: 59 | Admitting: Psychiatry

## 2018-11-19 ENCOUNTER — Ambulatory Visit (INDEPENDENT_AMBULATORY_CARE_PROVIDER_SITE_OTHER): Payer: 59 | Admitting: Psychiatry

## 2018-11-19 ENCOUNTER — Other Ambulatory Visit: Payer: Self-pay

## 2018-11-19 DIAGNOSIS — F410 Panic disorder [episodic paroxysmal anxiety] without agoraphobia: Secondary | ICD-10-CM | POA: Diagnosis not present

## 2018-11-19 DIAGNOSIS — F3341 Major depressive disorder, recurrent, in partial remission: Secondary | ICD-10-CM | POA: Insufficient documentation

## 2018-11-19 DIAGNOSIS — F330 Major depressive disorder, recurrent, mild: Secondary | ICD-10-CM

## 2018-11-19 DIAGNOSIS — F3342 Major depressive disorder, recurrent, in full remission: Secondary | ICD-10-CM | POA: Insufficient documentation

## 2018-11-19 HISTORY — DX: Major depressive disorder, recurrent, mild: F33.0

## 2018-11-19 MED ORDER — CLONAZEPAM 1 MG PO TABS: 1.0000 mg | ORAL_TABLET | Freq: Every day | ORAL | 0 refills | Status: DC | PRN

## 2018-11-19 MED ORDER — TRAZODONE HCL 50 MG PO TABS: 50.0000 mg | ORAL_TABLET | Freq: Every evening | ORAL | 0 refills | Status: DC | PRN

## 2018-11-19 MED ORDER — DULOXETINE HCL 30 MG PO CPEP: 90.0000 mg | ORAL_CAPSULE | Freq: Every day | ORAL | 1 refills | Status: DC
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
## 2018-12-24 ENCOUNTER — Telehealth (HOSPITAL_COMMUNITY): Payer: Self-pay

## 2018-12-24 NOTE — Telephone Encounter (Signed)
Patient is calling for a refill on her Klonopin, she is supposed to take 1 a day as needed, you gave her 60 tabs on 7/7 and is out of them now. Please review and advise, thank you

## 2018-12-25 ENCOUNTER — Other Ambulatory Visit (HOSPITAL_COMMUNITY): Payer: Self-pay | Admitting: Psychiatry

## 2018-12-25 MED ORDER — CLONAZEPAM 1 MG PO TABS: 1.0000 mg | ORAL_TABLET | Freq: Every day | ORAL | 0 refills | Status: DC | PRN

## 2018-12-25 NOTE — Telephone Encounter (Signed)
Clearly was unable to come down from bid to once daily - I renewed it.

## 2019-01-23 ENCOUNTER — Other Ambulatory Visit: Payer: Self-pay

## 2019-01-23 ENCOUNTER — Ambulatory Visit (INDEPENDENT_AMBULATORY_CARE_PROVIDER_SITE_OTHER): Payer: 59 | Admitting: Psychiatry

## 2019-01-23 DIAGNOSIS — F410 Panic disorder [episodic paroxysmal anxiety] without agoraphobia: Secondary | ICD-10-CM

## 2019-01-23 DIAGNOSIS — F3341 Major depressive disorder, recurrent, in partial remission: Secondary | ICD-10-CM | POA: Diagnosis not present

## 2019-01-23 MED ORDER — DULOXETINE HCL 30 MG PO CPEP: 90.0000 mg | ORAL_CAPSULE | Freq: Every day | ORAL | 2 refills | Status: DC

## 2019-01-23 MED ORDER — CLONAZEPAM 1 MG PO TABS: 1.0000 mg | ORAL_TABLET | Freq: Every day | ORAL | 2 refills | Status: DC | PRN
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
## 2019-04-24 ENCOUNTER — Other Ambulatory Visit: Payer: Self-pay

## 2019-04-24 ENCOUNTER — Ambulatory Visit (INDEPENDENT_AMBULATORY_CARE_PROVIDER_SITE_OTHER): Payer: 59 | Admitting: Psychiatry

## 2019-04-24 DIAGNOSIS — F3341 Major depressive disorder, recurrent, in partial remission: Secondary | ICD-10-CM

## 2019-04-24 DIAGNOSIS — F410 Panic disorder [episodic paroxysmal anxiety] without agoraphobia: Secondary | ICD-10-CM

## 2019-04-24 MED ORDER — DULOXETINE HCL 30 MG PO CPEP: 90.0000 mg | ORAL_CAPSULE | Freq: Every day | ORAL | 2 refills | Status: DC

## 2019-04-24 MED ORDER — CLONAZEPAM 1 MG PO TABS: 1.0000 mg | ORAL_TABLET | Freq: Two times a day (BID) | ORAL | 2 refills | Status: DC | PRN
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
## 2019-06-09 DIAGNOSIS — Z30430 Encounter for insertion of intrauterine contraceptive device: Secondary | ICD-10-CM | POA: Insufficient documentation

## 2019-07-23 ENCOUNTER — Ambulatory Visit (INDEPENDENT_AMBULATORY_CARE_PROVIDER_SITE_OTHER): Payer: Medicaid Other | Admitting: Psychiatry

## 2019-07-23 ENCOUNTER — Other Ambulatory Visit: Payer: Self-pay

## 2019-07-23 DIAGNOSIS — F3341 Major depressive disorder, recurrent, in partial remission: Secondary | ICD-10-CM

## 2019-07-23 DIAGNOSIS — F410 Panic disorder [episodic paroxysmal anxiety] without agoraphobia: Secondary | ICD-10-CM | POA: Diagnosis not present

## 2019-07-23 MED ORDER — DULOXETINE HCL 30 MG PO CPEP: 90.0000 mg | ORAL_CAPSULE | Freq: Every day | ORAL | 2 refills | Status: DC

## 2019-07-23 MED ORDER — CLONAZEPAM 1 MG PO TABS: 1.0000 mg | ORAL_TABLET | Freq: Two times a day (BID) | ORAL | 2 refills | Status: DC | PRN
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
## 2019-08-11 ENCOUNTER — Other Ambulatory Visit (HOSPITAL_COMMUNITY): Payer: Self-pay

## 2019-08-11 MED ORDER — DULOXETINE HCL 30 MG PO CPEP: 90.0000 mg | ORAL_CAPSULE | Freq: Every day | ORAL | 2 refills | Status: DC

## 2019-08-25 ENCOUNTER — Other Ambulatory Visit (HOSPITAL_COMMUNITY): Payer: Self-pay

## 2019-08-25 MED ORDER — DULOXETINE HCL 30 MG PO CPEP: 90.0000 mg | ORAL_CAPSULE | Freq: Every day | ORAL | 2 refills | Status: DC

## 2019-10-27 ENCOUNTER — Telehealth (INDEPENDENT_AMBULATORY_CARE_PROVIDER_SITE_OTHER): Payer: Medicaid Other | Admitting: Psychiatry

## 2019-10-27 ENCOUNTER — Other Ambulatory Visit: Payer: Self-pay

## 2019-10-27 DIAGNOSIS — F410 Panic disorder [episodic paroxysmal anxiety] without agoraphobia: Secondary | ICD-10-CM

## 2019-10-27 DIAGNOSIS — F330 Major depressive disorder, recurrent, mild: Secondary | ICD-10-CM | POA: Diagnosis not present

## 2019-10-27 MED ORDER — DULOXETINE HCL 60 MG PO CPEP: 120.0000 mg | ORAL_CAPSULE | Freq: Every day | ORAL | 2 refills | Status: DC

## 2019-10-27 MED ORDER — CLONAZEPAM 1 MG PO TABS: 1.0000 mg | ORAL_TABLET | Freq: Two times a day (BID) | ORAL | 2 refills | Status: DC | PRN
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
## 2019-11-13 ENCOUNTER — Other Ambulatory Visit (HOSPITAL_COMMUNITY): Payer: Self-pay | Admitting: Psychiatry

## 2019-12-05 ENCOUNTER — Other Ambulatory Visit (HOSPITAL_COMMUNITY): Payer: Self-pay | Admitting: Psychiatry

## 2019-12-11 ENCOUNTER — Telehealth (HOSPITAL_COMMUNITY): Payer: Self-pay | Admitting: *Deleted

## 2019-12-11 NOTE — Telephone Encounter (Signed)
Patient called asking for an updated letter of approval to keep her emotional support animal. She is moving to an new apartment.

## 2019-12-17 ENCOUNTER — Telehealth (HOSPITAL_COMMUNITY): Payer: Self-pay

## 2019-12-17 NOTE — Telephone Encounter (Signed)
Then we should provide her with such. I thought this was done as she called Taylor Oneill about it some time ago.

## 2019-12-17 NOTE — Telephone Encounter (Signed)
Patient called asking for aletter of approval to keep her emotional support animal. She is moving to an new apartment.

## 2019-12-18 ENCOUNTER — Encounter (HOSPITAL_COMMUNITY): Payer: Self-pay

## 2019-12-22 ENCOUNTER — Telehealth (INDEPENDENT_AMBULATORY_CARE_PROVIDER_SITE_OTHER): Payer: Medicaid Other | Admitting: Psychiatry

## 2019-12-22 ENCOUNTER — Other Ambulatory Visit: Payer: Self-pay

## 2019-12-22 DIAGNOSIS — F410 Panic disorder [episodic paroxysmal anxiety] without agoraphobia: Secondary | ICD-10-CM | POA: Diagnosis not present

## 2019-12-22 DIAGNOSIS — F330 Major depressive disorder, recurrent, mild: Secondary | ICD-10-CM
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
## 2020-01-02 NOTE — Telephone Encounter (Signed)
Done

## 2020-03-19 ENCOUNTER — Telehealth (INDEPENDENT_AMBULATORY_CARE_PROVIDER_SITE_OTHER): Payer: BLUE CROSS/BLUE SHIELD | Admitting: Psychiatry

## 2020-03-19 ENCOUNTER — Other Ambulatory Visit: Payer: Self-pay

## 2020-03-19 DIAGNOSIS — F3342 Major depressive disorder, recurrent, in full remission: Secondary | ICD-10-CM | POA: Diagnosis not present

## 2020-03-19 DIAGNOSIS — F410 Panic disorder [episodic paroxysmal anxiety] without agoraphobia: Secondary | ICD-10-CM | POA: Diagnosis not present

## 2020-03-19 MED ORDER — DULOXETINE HCL 60 MG PO CPEP: ORAL_CAPSULE | ORAL | 1 refills | Status: DC

## 2020-03-19 MED ORDER — CLONAZEPAM 1 MG PO TABS: 1.0000 mg | ORAL_TABLET | Freq: Two times a day (BID) | ORAL | 2 refills | Status: DC | PRN
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
## 2020-05-11 ENCOUNTER — Other Ambulatory Visit (HOSPITAL_COMMUNITY): Payer: Self-pay | Admitting: Psychiatry

## 2020-05-25 DIAGNOSIS — H5213 Myopia, bilateral: Secondary | ICD-10-CM | POA: Diagnosis not present

## 2020-06-21 ENCOUNTER — Telehealth (HOSPITAL_COMMUNITY): Payer: Medicaid Other | Admitting: Psychiatry

## 2020-06-21 ENCOUNTER — Other Ambulatory Visit: Payer: Self-pay

## 2020-06-25 ENCOUNTER — Other Ambulatory Visit (HOSPITAL_COMMUNITY): Payer: Self-pay | Admitting: Psychiatry

## 2020-06-25 ENCOUNTER — Telehealth (HOSPITAL_COMMUNITY): Payer: Self-pay | Admitting: *Deleted

## 2020-06-25 ENCOUNTER — Other Ambulatory Visit: Payer: Self-pay

## 2020-06-25 ENCOUNTER — Telehealth (INDEPENDENT_AMBULATORY_CARE_PROVIDER_SITE_OTHER): Payer: 59 | Admitting: Psychiatry

## 2020-06-25 DIAGNOSIS — F330 Major depressive disorder, recurrent, mild: Secondary | ICD-10-CM

## 2020-06-25 DIAGNOSIS — F410 Panic disorder [episodic paroxysmal anxiety] without agoraphobia: Secondary | ICD-10-CM

## 2020-06-25 MED ORDER — BUPROPION HCL ER (XL) 150 MG PO TB24: 150.0000 mg | ORAL_TABLET | Freq: Every day | ORAL | 1 refills | Status: DC

## 2020-06-25 NOTE — Telephone Encounter (Signed)
Pt called concerned about starting the Wellbutrin as she has a h/o of seizures. Please advise. Thanks.
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
## 2020-06-25 NOTE — Telephone Encounter (Signed)
This should not be an issue with a low dose of bupropion and her being on Lamictal.

## 2020-06-28 ENCOUNTER — Telehealth (HOSPITAL_COMMUNITY): Payer: Self-pay | Admitting: *Deleted

## 2020-06-28 NOTE — Telephone Encounter (Signed)
Writer left VM for pt concerning Wellbutrin. Pt advised per Dr. Demetrius Charity that pt may continue medication despite h/o seizures as she is also currently taking lamictal. Pt encouraged to call with any further questions or concerns.

## 2020-07-27 ENCOUNTER — Other Ambulatory Visit: Payer: Self-pay

## 2020-07-27 ENCOUNTER — Telehealth (HOSPITAL_COMMUNITY): Payer: Medicaid Other | Admitting: Psychiatry

## 2020-07-30 ENCOUNTER — Other Ambulatory Visit: Payer: Self-pay

## 2020-07-30 ENCOUNTER — Telehealth (INDEPENDENT_AMBULATORY_CARE_PROVIDER_SITE_OTHER): Payer: 59 | Admitting: Psychiatry

## 2020-07-30 DIAGNOSIS — F410 Panic disorder [episodic paroxysmal anxiety] without agoraphobia: Secondary | ICD-10-CM | POA: Diagnosis not present

## 2020-07-30 DIAGNOSIS — F330 Major depressive disorder, recurrent, mild: Secondary | ICD-10-CM

## 2020-07-30 MED ORDER — CLONAZEPAM 1 MG PO TABS: 1.0000 mg | ORAL_TABLET | Freq: Every day | ORAL | 2 refills | Status: DC | PRN
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
## 2020-08-23 ENCOUNTER — Telehealth (HOSPITAL_COMMUNITY): Payer: Self-pay

## 2020-08-23 ENCOUNTER — Other Ambulatory Visit: Payer: Self-pay | Admitting: Psychiatry

## 2020-08-23 MED ORDER — BUPROPION HCL ER (XL) 150 MG PO TB24: 150.0000 mg | ORAL_TABLET | Freq: Every day | ORAL | 0 refills | Status: DC

## 2020-08-23 NOTE — Telephone Encounter (Signed)
Thank you Dr. Vanetta Shawl. Notified patient

## 2020-08-23 NOTE — Telephone Encounter (Signed)
According to the note from Dr. Demetrius Charity, he wanted this patient to be seen in a month by a new provider. Please make sure that he has a follow up appointment. I ordered for 30 days (Dr. Demetrius Charity used to give him monthly refill only, fyi).

## 2020-08-23 NOTE — Telephone Encounter (Signed)
This was Dr. Stanton Kidney patient. Received a fax from [facility] on [address]/[place] requesting a refill for a 90 day supply on patient's Bupropion 150mg  24 hr tablet. Patient's last appt 3/18. No followup scheduled at this time. Please review and advise. Thank you

## 2020-11-11 ENCOUNTER — Telehealth (HOSPITAL_COMMUNITY): Payer: Self-pay | Admitting: *Deleted

## 2020-11-11 MED ORDER — BUPROPION HCL ER (XL) 150 MG PO TB24: 150.0000 mg | ORAL_TABLET | Freq: Every day | ORAL | 0 refills | Status: DC

## 2020-11-11 MED ORDER — DULOXETINE HCL 60 MG PO CPEP: ORAL_CAPSULE | ORAL | 0 refills | Status: DC

## 2020-11-11 MED ORDER — CLONAZEPAM 1 MG PO TABS: 1.0000 mg | ORAL_TABLET | Freq: Every day | ORAL | 0 refills | Status: DC | PRN

## 2020-11-11 NOTE — Telephone Encounter (Signed)
Former pt of Dr. Hinton Dyer who has not received letter yet (sent out today) is requesting refill of Wellbutrin, Cymbalta, and Klonopin. Pt is on Lamictal for seizures and mood stabilization however it does not appear that Dr. Hinton Dyer was prescribing that. Please review.

## 2020-11-11 NOTE — Telephone Encounter (Signed)
Done

## 2021-03-01 DIAGNOSIS — F411 Generalized anxiety disorder: Secondary | ICD-10-CM | POA: Diagnosis not present

## 2021-03-01 DIAGNOSIS — F331 Major depressive disorder, recurrent, moderate: Secondary | ICD-10-CM | POA: Diagnosis not present

## 2021-03-15 DIAGNOSIS — F3132 Bipolar disorder, current episode depressed, moderate: Secondary | ICD-10-CM | POA: Diagnosis not present

## 2021-03-15 DIAGNOSIS — F411 Generalized anxiety disorder: Secondary | ICD-10-CM | POA: Diagnosis not present

## 2021-03-29 DIAGNOSIS — F411 Generalized anxiety disorder: Secondary | ICD-10-CM | POA: Diagnosis not present

## 2021-03-29 DIAGNOSIS — F3132 Bipolar disorder, current episode depressed, moderate: Secondary | ICD-10-CM | POA: Diagnosis not present

## 2021-03-30 DIAGNOSIS — F319 Bipolar disorder, unspecified: Secondary | ICD-10-CM | POA: Diagnosis not present

## 2021-06-01 DIAGNOSIS — H35372 Puckering of macula, left eye: Secondary | ICD-10-CM | POA: Diagnosis not present

## 2021-06-01 DIAGNOSIS — H52223 Regular astigmatism, bilateral: Secondary | ICD-10-CM | POA: Diagnosis not present

## 2021-06-01 DIAGNOSIS — H40053 Ocular hypertension, bilateral: Secondary | ICD-10-CM | POA: Diagnosis not present

## 2021-06-01 DIAGNOSIS — H5213 Myopia, bilateral: Secondary | ICD-10-CM | POA: Diagnosis not present

## 2021-06-21 DIAGNOSIS — F3132 Bipolar disorder, current episode depressed, moderate: Secondary | ICD-10-CM | POA: Diagnosis not present

## 2021-06-23 DIAGNOSIS — R569 Unspecified convulsions: Secondary | ICD-10-CM | POA: Diagnosis not present

## 2021-07-05 DIAGNOSIS — F3132 Bipolar disorder, current episode depressed, moderate: Secondary | ICD-10-CM | POA: Diagnosis not present

## 2021-07-07 DIAGNOSIS — F3176 Bipolar disorder, in full remission, most recent episode depressed: Secondary | ICD-10-CM | POA: Diagnosis not present

## 2021-07-13 DIAGNOSIS — J209 Acute bronchitis, unspecified: Secondary | ICD-10-CM | POA: Diagnosis not present

## 2021-07-13 DIAGNOSIS — Z03818 Encounter for observation for suspected exposure to other biological agents ruled out: Secondary | ICD-10-CM | POA: Diagnosis not present

## 2021-07-13 DIAGNOSIS — J019 Acute sinusitis, unspecified: Secondary | ICD-10-CM | POA: Diagnosis not present

## 2021-07-13 DIAGNOSIS — B9689 Other specified bacterial agents as the cause of diseases classified elsewhere: Secondary | ICD-10-CM | POA: Diagnosis not present

## 2021-07-21 DIAGNOSIS — F3132 Bipolar disorder, current episode depressed, moderate: Secondary | ICD-10-CM | POA: Diagnosis not present

## 2021-11-01 DIAGNOSIS — G40219 Localization-related (focal) (partial) symptomatic epilepsy and epileptic syndromes with complex partial seizures, intractable, without status epilepticus: Secondary | ICD-10-CM | POA: Diagnosis not present

## 2021-11-01 DIAGNOSIS — G479 Sleep disorder, unspecified: Secondary | ICD-10-CM | POA: Diagnosis not present

## 2021-11-01 DIAGNOSIS — Z79899 Other long term (current) drug therapy: Secondary | ICD-10-CM | POA: Diagnosis not present

## 2021-12-14 ENCOUNTER — Ambulatory Visit
Admission: EM | Admit: 2021-12-14 | Discharge: 2021-12-14 | Disposition: A | Discharge status: Home / Self Care | Payer: Worker's Compensation

## 2021-12-14 ENCOUNTER — Ambulatory Visit (INDEPENDENT_AMBULATORY_CARE_PROVIDER_SITE_OTHER): Payer: Medicaid Other

## 2021-12-14 DIAGNOSIS — M25571 Pain in right ankle and joints of right foot: Secondary | ICD-10-CM | POA: Diagnosis not present

## 2021-12-14 DIAGNOSIS — S93401A Sprain of unspecified ligament of right ankle, initial encounter: Secondary | ICD-10-CM | POA: Diagnosis not present

## 2021-12-14 NOTE — ED Triage Notes (Signed)
Pateint to UC with complaints or right knee injury, reports injury occurred on Saturday.States that she fell on a wet floor at work. Reports pain now extended into her right ankle. Pain when bearing weight on extremity and when bending. Patient reports on Sunday she started having numbness and tingling in her right foot, has been loosing bandages.

## 2021-12-14 NOTE — Discharge Instructions (Signed)
Keep your ankle elevated is much as possible to help decrease swelling and aid in healing.  Apply moist heat to your ankle for 20 minutes at a time to help improve blood flow which will bring fresh oxygen and nutrients to the ligaments and help facilitate the removal of metabolic byproducts from inflammation.  Take over-the-counter ibuprofen, 600 mg (3 tablets) every 6 hours with food to help with inflammation and pain.  Wear your ankle brace when up and moving.  You may take it off at nighttime, when bathing, and when not walking on her ankle.  Follow the rehabilitation exercises given your discharge instructions.  Wait to start the phase 1 exercises until 48 hours after your injury to give time for the inflammation to go down.  Progress to phase 2 after you can complete phase 1 with out any significant pain.   If your symptoms persist I recommend following back up with [facility] as you may need more complex imaging which we are unable to order from the urgent care.

## 2021-12-14 NOTE — ED Provider Notes (Signed)
[facility]    CSN: [id number] Arrival date & time: 12/14/21  1558      History   Chief Complaint Chief Complaint  Patient presents with   Leg Injury    HPI Taylor Oneill is a 26 y.o. female.   HPI  Patient is a nontoxic-appearing 26 year old female here for evaluation of pain and swelling and right ankle.  Patient reports that she slipped on water at work 5 days ago and reports that she landed on her knee.  She was evaluated at [facility] and had negative x-rays.  They attempted to place her in a knee brace but she stated at that time that the brace made her knee feel weaker so they treated her with ibuprofen, elevation, rest, ice, and an Ace wrap.  Patient reports that she did that all Saturday night and when she woke up Sunday morning she was having pain radiating down into her right ankle as well as swelling.  She states that at the time of her evaluation and injury she does not member having pain in her ankle but it has developed since.  She also reports numbness and tingling in her toes and bruising of her ankle.  The pain increases with weightbearing.  Patient is wearing a an ankle brace in the exam room.  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    OB History   No obstetric history on file.      Home Medications    Prior to Admission medications   Medication Sig Start Date End Date Taking? Authorizing Provider  DULoxetine (CYMBALTA) 60 MG capsule TAKE 2 CAPSULES BY MOUTH EVERY DAY AT 6 PM 11/11/20  Yes Arfeen, Phillips Grout, MD  lamoTRIgine (LAMICTAL) 150 MG tablet Take 150 mg by mouth  daily.   Yes [provider]  traZODone (DESYREL) 50 MG tablet Take 50 mg by mouth at bedtime.   Yes [provider]    Family History Family History  Problem Relation Age of Onset   Seizures Mother    Migraines Mother    Alcohol abuse Mother    Anxiety disorder Mother    Depression Mother    ADD / ADHD Father    Anxiety disorder Father    Depression Father    ADD / ADHD Brother    Dementia Maternal Grandfather    Dementia Paternal Grandmother     Social History Social History   Tobacco Use   Smoking status: Some Days    Packs/day: 0.75    Years: 8.00    Total pack years: 6.00    Types: Cigarettes   Smokeless tobacco: Never  Vaping Use   Vaping Use: Former  Substance Use Topics   Alcohol use: Yes    Alcohol/week: 3.0 standard drinks of alcohol    Types: 2 Glasses of wine, 1 Standard drinks or equivalent per week   Drug use: No     Allergies   Codeine and Tylagesic [acetaminophen]   Review of Systems Review of Systems  Musculoskeletal:  Positive for arthralgias and joint swelling.  Skin:  Positive for color change.  Neurological:  Positive for numbness. Negative for weakness.     Physical Exam Triage Vital Signs ED Triage Vitals  Enc Vitals Group     BP 12/14/21 1614 (!) 145/88     Pulse Rate 12/14/21 1614 (!) 102     Resp 12/14/21 1614 18     Temp 12/14/21 1614 99.1 F (37.3 C)     Temp Source 12/14/21 1614 Oral     SpO2 12/14/21 1614 100 %     Weight 12/14/21 1615 200 lb (90.7 kg)     Height 12/14/21 1615 5\' 7"  (1.702 m)     Head Circumference --      Peak Flow --      Pain Score 12/14/21 1615 7     Pain Loc --      Pain Edu? --      Excl. in GC? --    No data found.  Updated Vital Signs BP (!) 145/88   Pulse (!) 102   Temp 99.1 F (37.3 C) (Oral)   Resp 18   Ht 5\' 7"  (1.702 m)   Wt 200 lb (90.7 kg)   LMP 12/07/2021   SpO2 100%   BMI 31.32 kg/m   Visual Acuity Right Eye Distance:   Left Eye Distance:    Bilateral Distance:    Right Eye Near:   Left Eye Near:    Bilateral Near:     Physical Exam Vitals and nursing note reviewed.  Constitutional:      Appearance: Normal appearance. She is not ill-appearing.  HENT:     Head: Normocephalic and atraumatic.  Musculoskeletal:        General: Tenderness and signs of injury present. No swelling or deformity. Normal range of motion.  Skin:    General: Skin is warm and dry.     Capillary Refill: Capillary refill takes less than 2 seconds.     Findings: No bruising or erythema.  Neurological:     General: No focal deficit present.     Mental Status: She is alert and oriented to person, place, and time.     Sensory: Sensory deficit present.     Motor: No weakness.  Psychiatric:        Mood and Affect: Mood normal.        Behavior: Behavior normal.        Thought Content: Thought content normal.        Judgment: Judgment normal.      UC Treatments / Results  Labs (all labs ordered are listed, but only abnormal results are displayed) Labs Reviewed - No data to display  EKG   Radiology DG Ankle Complete Right  Result Date: 12/14/2021 CLINICAL DATA:  Anteromedial ankle pain. EXAM: RIGHT ANKLE - COMPLETE 3+ VIEW COMPARISON:  None Available. FINDINGS: There is no evidence of fracture, dislocation, or joint effusion. There is no evidence of arthropathy or other focal bone abnormality. Soft tissues are unremarkable. IMPRESSION: Negative. Electronically Signed   By: 12/09/2021 D.O.   On: 12/14/2021 16:49    Procedures Procedures (including critical care time)  Medications Ordered in UC Medications - No data to display  Initial Impression / Assessment and Plan / UC Course  I have reviewed the triage vital signs and the nursing notes.  Pertinent labs & imaging results that were available during my care of the patient were reviewed by me and considered in my medical decision making (see chart for  details).  Patient is a  nontoxic-appearing 26 year old female here for evaluation of right ankle pain that developed today after she suffered a ground-level fall at work.  She states that she fell and sustained a ground-level fall and impact on her right knee, which was already evaluated at [facility] and had negative x-rays, but does not remember having any pain in her right ankle at that time.  She reports that the pain developed the next day.  She decays that she is having pain, swelling, pain with range of motion, pain with weightbearing, and numbness and tingling in the toes.  On exam patient's right foot and ankle are normal anatomical alignment and there is no edema or ecchymosis noted.  Patient reports that her pain is primarily in the medial and anterior medial aspect of the ankle.  DP and PT pulses in the right ankle are 2+.  Her cap refill is 3 seconds.  She does have full range of motion her toes but states that she has decreased sensation in her toes.  She has pain with palpation of the proximal midfoot, anterior ankle, medial malleolus, and her Achilles tendon.  No pain with palpation of the calcaneus, arch of her foot, or lateral malleolus.  Patient has full range of motion of her ankle but she states it does cause pain.  No crepitus appreciated on physical exam.  I will obtain radiograph of right ankle to look for bony abnormality.  I have advised the patient that I will not rex-ray her knee at this time since she had negative x-rays 5 days ago.  If she is continuing to have knee pain she should return to [facility] where she was initially evaluated and they can order an MRI.  Right ankle x-ray read by radiology as negative for fracture.  I will discharge patient home with diagnosis of right ankle sprain.  She has a splint already.  I will encourage her to keep her right ankle elevated is much as possible, continue to apply ice to her ankle and knee, take ibuprofen as previously prescribed, and if her symptom persist  or worsen she should follow back up with [facility] for more detailed imaging.   Final Clinical Impressions(s) / UC Diagnoses   Final diagnoses:  Sprain of right ankle, unspecified ligament, initial encounter     Discharge Instructions      Keep your ankle elevated is much as possible to help decrease swelling and aid in healing.  Apply moist heat to your ankle for 20 minutes at a time to help improve blood flow which will bring fresh oxygen and nutrients to the ligaments and help facilitate the removal of metabolic byproducts from inflammation.  Take over-the-counter ibuprofen, 600 mg (3 tablets) every 6 hours with food to help with inflammation and pain.  Wear your ankle brace when up and moving.  You may take it off at nighttime, when bathing, and when not walking on her ankle.  Follow the rehabilitation exercises given your discharge instructions.  Wait to start the phase 1 exercises until 48 hours after your injury to give time for the inflammation to go down.  Progress to phase 2 after you can complete phase 1 with out any significant pain.   If your symptoms persist I recommend following back up with [facility] as you may need more complex imaging which we are unable to order from the urgent care.     ED Prescriptions   None    PDMP not reviewed this encounter.  Becky Augusta, NP 12/14/21 1655

## 2022-01-22 ENCOUNTER — Ambulatory Visit
Admission: EM | Admit: 2022-01-22 | Discharge: 2022-01-22 | Disposition: A | Discharge status: Home / Self Care | Payer: Medicaid Other | Attending: Family Medicine | Admitting: Family Medicine

## 2022-01-22 ENCOUNTER — Encounter: Payer: Self-pay | Admitting: Emergency Medicine

## 2022-01-22 DIAGNOSIS — T3695XA Adverse effect of unspecified systemic antibiotic, initial encounter: Secondary | ICD-10-CM

## 2022-01-22 DIAGNOSIS — B370 Candidal stomatitis: Secondary | ICD-10-CM

## 2022-01-22 HISTORY — DX: Acute vaginitis: N76.0

## 2022-01-22 HISTORY — DX: Other specified bacterial agents as the cause of diseases classified elsewhere: B96.89

## 2022-01-22 MED ORDER — FLUCONAZOLE 150 MG PO TABS: 150.0000 mg | ORAL_TABLET | ORAL | 0 refills | Status: AC

## 2022-01-22 MED ORDER — NYSTATIN 100000 UNIT/ML MT SUSP: 500000.0000 [IU] | Freq: Four times a day (QID) | OROMUCOSAL | 0 refills | Status: DC

## 2022-01-22 NOTE — Discharge Instructions (Addendum)
Complete your metronidazole as discussed.  Take 1 tablet of Diflucan today and every 3 days take an additional tablet for 3 doses total.  Use the nystatin suspension until the mouth clears and then 2 days after that.

## 2022-01-22 NOTE — ED Provider Notes (Signed)
[facility]    CSN: [id number] Arrival date & time: 01/22/22  1445      History   Chief Complaint Chief Complaint  Patient presents with   Taylor Oneill    HPI Taylor Oneill is a 26 y.o. female.   HPI   Para presents for sore throat, white coating to her tongue that started on Friday.  Endorses painful swallowing.  Symptoms started after being treated for bacterial vaginosis with metronidazole.  She is also having new vaginal itching and the discharge has not stopped.  There has been no vaginal bleeding, fever, belly pain, back pain or dysuria.   Past Medical History:  Diagnosis Date   Anxiety    Asthma    BV (bacterial vaginosis)    Epilepsy ([place]) 02/2014   Seizure [facility])     Patient Active Problem List   Diagnosis Date Noted   Major depressive disorder, recurrent episode, mild ([place]) 11/19/2018   Panic disorder 08/07/2018    Past Surgical History:  Procedure Laterality Date   NO PAST SURGERIES     WISDOM TOOTH EXTRACTION Bilateral 03/2010   Both bottom wisdom teeth.    OB History   No obstetric history on file.      Home Medications    Prior to Admission medications   Medication Sig Start Date End Date Taking? Authorizing Provider  DULoxetine (CYMBALTA) 60 MG capsule TAKE 2 CAPSULES BY MOUTH EVERY DAY AT 6 PM 11/11/20  Yes Arfeen, Arlyce Harman, MD  fluconazole (DIFLUCAN) 150 MG tablet Take 1 tablet (150 mg total) by mouth every 3 (three) days for 3 doses. 01/22/22 01/29/22 Yes Charlene Detter, DO  lamoTRIgine (LAMICTAL) 150 MG tablet Take 150 mg by mouth daily.   Yes [provider]  metroNIDAZOLE (FLAGYL) 500 MG tablet Take 500 mg by mouth 2 (two) times daily. 01/13/22  Yes [provider]  nystatin (MYCOSTATIN) 100000 UNIT/ML suspension Take 5 mLs (500,000 Units total) by mouth 4 (four) times daily. 01/22/22  Yes Paullette Mckain, DO  traZODone (DESYREL) 50 MG tablet Take 50 mg by mouth at bedtime.   Yes [provider]     Family History Family History  Problem Relation Age of Onset   Seizures Mother    Migraines Mother    Alcohol abuse Mother    Anxiety disorder Mother    Depression Mother    ADD / ADHD Father    Anxiety disorder Father    Depression Father    ADD / ADHD Brother    Dementia Maternal Grandfather    Dementia Paternal Grandmother     Social History Social History   Tobacco Use   Smoking status: Some Days    Packs/day: 0.75    Years: 8.00    Total pack years: 6.00    Types: Cigarettes   Smokeless tobacco: Never  Vaping Use   Vaping Use: Former  Substance Use Topics   Alcohol use: Yes    Alcohol/week: 3.0 standard drinks of alcohol    Types: 2 Glasses of wine, 1 Standard drinks or equivalent per week   Drug use: No     Allergies   Codeine and Tylagesic [acetaminophen]   Review of Systems Review of Systems: negative unless otherwise stated in HPI.      Physical Exam Triage Vital Signs ED Triage Vitals  Enc Vitals Group     BP 01/22/22 1531 (!) 141/90     Pulse Rate 01/22/22 1531 (!) 102     Resp  01/22/22 1531 14     Temp 01/22/22 1531 99.7 F (37.6 C)     Temp Source 01/22/22 1531 Oral     SpO2 01/22/22 1531 98 %     Weight 01/22/22 1528 182 lb (82.6 kg)     Height 01/22/22 1528 5\' 7"  (1.702 m)     Head Circumference --      Peak Flow --      Pain Score 01/22/22 1528 7     Pain Loc --      Pain Edu? --      Excl. in [place]? --    No data found.  Updated Vital Signs BP (!) 141/90 (BP Location: Left Arm)   Pulse (!) 102   Temp 99.7 F (37.6 C) (Oral)   Resp 14   Ht 5\' 7"  (1.702 m)   Wt 82.6 kg   LMP 12/26/2021 (Approximate)   SpO2 98%   BMI 28.51 kg/m   Visual Acuity Right Eye Distance:   Left Eye Distance:   Bilateral Distance:    Right Eye Near:   Left Eye Near:    Bilateral Near:     Physical Exam GEN:     alert, non-toxic appearing female in no distress    HENT:  mucus membranes moist, white patches on the tongue and buccal  mucosa,  mild oropharyngeal erythema, no clear nasal discharge EYES:   pupils equal and reactive, EOMi, no scleral injection NECK:  normal ROM, no lymphadenopathy, no meningismus   RESP:  no increased work of breathing CVS:   regular rate and rhythm GU: deferred  Skin:   warm and dry    UC Treatments / Results  Labs (all labs ordered are listed, but only abnormal results are displayed) Labs Reviewed - No data to display  EKG   Radiology No results found.  Procedures Procedures (including critical care time)  Medications Ordered in UC Medications - No data to display  Initial Impression / Assessment and Plan / UC Course  I have reviewed the triage vital signs and the nursing notes.  Pertinent labs & imaging results that were available during my care of the patient were reviewed by me and considered in my medical decision making (see chart for details).       Pt is a 26 y.o. female who presents for acute onset of white discharge in her mouth and new vaginal itching after taking Flagyl.  Taylor Oneill is afebrile here without recent antipyretics. Satting well on room air. Overall pt is well appearing, well hydrated, without respiratory distress.  Initially tachycardic but this has resolved on exam.  She has white plaque and new vaginal itching concerning for thrush and Candida or yeast infection associated with the Flagyl use for BV.  She had a negative HIV test 3 years ago.  Treat with Diflucan 3 dose series every 72 hours.  Given nystatin suspension as well.  Reviewed return and ED precautions and patient and mom voiced understanding.  Discussed MDM, treatment plan and plan for follow-up with patient/parent who agrees with plan.     Final Clinical Impressions(s) / UC Diagnoses   Final diagnoses:  Thrush  Antibiotic-induced yeast infection     Discharge Instructions      Complete your metronidazole as discussed.  Take 1 tablet of Diflucan today and every 3 days take an  additional tablet for 3 doses total.  Use the nystatin suspension until the mouth clears and then 2 days after that.  ED Prescriptions     Medication Sig Dispense Auth. Provider   fluconazole (DIFLUCAN) 150 MG tablet Take 1 tablet (150 mg total) by mouth every 3 (three) days for 3 doses. 3 tablet Taylor Demo, DO   nystatin (MYCOSTATIN) 100000 UNIT/ML suspension Take 5 mLs (500,000 Units total) by mouth 4 (four) times daily. 60 mL Taylor Cabal, DO      PDMP not reviewed this encounter.   Taylor Cabal, DO 01/22/22 2320

## 2022-01-22 NOTE — ED Triage Notes (Signed)
Patient is currently on Flagyl.  Patient reports having a white coating on her tongue on Friday.

## 2022-01-31 ENCOUNTER — Ambulatory Visit
Admission: RE | Admit: 2022-01-31 | Discharge: 2022-01-31 | Disposition: A | Discharge status: Home / Self Care | Payer: Self-pay | Source: Ambulatory Visit | Attending: Internal Medicine | Admitting: Internal Medicine

## 2022-01-31 VITALS — BP 133/83 | HR 89 | Temp 98.8°F | Resp 16

## 2022-01-31 DIAGNOSIS — K121 Other forms of stomatitis: Secondary | ICD-10-CM

## 2022-01-31 LAB — GROUP A STREP BY PCR: Group A Strep by PCR: NOT DETECTED

## 2022-01-31 MED ORDER — FAMCICLOVIR 500 MG PO TABS
500.0000 mg | ORAL_TABLET | Freq: Two times a day (BID) | ORAL | 1 refills | Status: AC
Start: 2022-01-31 — End: 2022-02-10

## 2022-01-31 MED ORDER — FLUCONAZOLE 200 MG PO TABS: 200.0000 mg | ORAL_TABLET | Freq: Every day | ORAL | 0 refills | Status: AC

## 2022-01-31 NOTE — ED Provider Notes (Signed)
[facility]    CSN: [id number] Arrival date & time: 01/31/22  1155      History   Chief Complaint Chief Complaint  Patient presents with   Ginette Pitman    HPI Taylor Oneill is a 26 y.o. female.  She was seen at the urgent care on September 10 with white coating in her mouth, painful mouth and throat, vaginal itching, which started several days previous after taking metronidazole for vaginal concerns.  At the visit on September 10, she was prescribed nystatin and 3 doses of Diflucan; symptoms of severe mouth and throat pain have improved by about 40%, and she is able to drink better, still having a lot of pain with eating.  Vaginal itching has resolved.  She estimates that she has lost 20 to 30 pounds in the last few weeks.  Also having some itchy rash in skin folds.  HPI  Past Medical History:  Diagnosis Date   Anxiety    Asthma    BV (bacterial vaginosis)    Epilepsy ([facility]) 02/2014   Seizure [facility])     Patient Active Problem List   Diagnosis Date Noted   Major depressive disorder, recurrent episode, mild ([facility]) 11/19/2018   Panic disorder 08/07/2018    Past Surgical History:  Procedure Laterality Date   NO PAST SURGERIES     WISDOM TOOTH EXTRACTION Bilateral 03/2010   Both bottom wisdom teeth.    OB History   No obstetric history on file.      Home Medications    Prior to Admission medications   Medication Sig Start Date End Date Taking? Authorizing Provider  DULoxetine (CYMBALTA) 60 MG capsule TAKE 2 CAPSULES BY MOUTH EVERY DAY AT 6 PM 11/11/20  Yes Arfeen, Phillips Grout, MD  famciclovir (FAMVIR) 500 MG tablet Take 1 tablet (500 mg total) by mouth 2 (two) times daily for 10 days. 01/31/22 02/10/22 Yes Isa Rankin, MD  fluconazole (DIFLUCAN) 200 MG tablet Take 1 tablet (200 mg total) by mouth daily for 7 days. 01/31/22 02/07/22 Yes Isa Rankin, MD  lamoTRIgine (LAMICTAL) 150 MG tablet Take 150 mg by mouth daily.   Yes [provider]   traZODone (DESYREL) 50 MG tablet Take 50 mg by mouth at bedtime.   Yes [provider]  metroNIDAZOLE (FLAGYL) 500 MG tablet Take 500 mg by mouth 2 (two) times daily. 01/13/22   [provider]  nystatin (MYCOSTATIN) 100000 UNIT/ML suspension Take 5 mLs (500,000 Units total) by mouth 4 (four) times daily. 01/22/22   Katha Cabal, DO    Family History Family History  Problem Relation Age of Onset   Seizures Mother    Migraines Mother    Alcohol abuse Mother    Anxiety disorder Mother    Depression Mother    ADD / ADHD Father    Anxiety disorder Father    Depression Father    ADD / ADHD Brother    Dementia Maternal Grandfather    Dementia Paternal Grandmother     Social History Social History   Tobacco Use   Smoking status: Some Days    Packs/day: 0.75    Years: 8.00    Total pack years: 6.00    Types: Cigarettes   Smokeless tobacco: Never  Vaping Use   Vaping Use: Former  Substance Use Topics   Alcohol use: Yes    Alcohol/week: 3.0 standard drinks of alcohol    Types: 2 Glasses of wine, 1 Standard drinks or equivalent per  week   Drug use: No     Allergies   Codeine, [place] sodium, and Tylagesic [acetaminophen]   Review of Systems Review of Systems   Physical Exam Triage Vital Signs ED Triage Vitals  Enc Vitals Group     BP 01/31/22 1218 133/83     Pulse Rate 01/31/22 1218 89     Resp 01/31/22 1218 16     Temp 01/31/22 1218 98.8 F (37.1 C)     Temp Source 01/31/22 1218 Oral     SpO2 01/31/22 1218 99 %     Weight --      Height --      Head Circumference --      Peak Flow --      Pain Score 01/31/22 1215 5     Pain Loc --      Pain Edu? --      Excl. in [place]? --    No data found.  Updated Vital Signs BP 133/83 (BP Location: Left Arm)   Pulse 89   Temp 98.8 F (37.1 C) (Oral)   Resp 16   LMP 12/26/2021 (Approximate)   SpO2 99%   Visual Acuity Right Eye Distance:   Left Eye Distance:   Bilateral Distance:    Right  Eye Near:   Left Eye Near:    Bilateral Near:     Physical Exam Constitutional:      General: She is not in acute distress.    Appearance: She is not ill-appearing.     Comments: Good hygiene  HENT:     Head: Atraumatic.     Comments: Bilateral TMs are translucent, no erythema No significant nasal congestion Marked ulceration of the left tonsillar area with a smaller, 5 to 6 mm erosion on the roof of the mouth.  There are white exudates on the left greater than the right tonsil.  There is a white coating on the tongue.    Mouth/Throat:     Mouth: Mucous membranes are moist.  Eyes:     Conjunctiva/sclera:     Right eye: Right conjunctiva is not injected. No exudate.    Left eye: Left conjunctiva is not injected. No exudate.    Comments: Conjugate gaze observed  Cardiovascular:     Rate and Rhythm: Normal rate and regular rhythm.  Pulmonary:     Effort: Pulmonary effort is normal. No respiratory distress.     Breath sounds: No wheezing or rhonchi.  Abdominal:     General: There is no distension.  Musculoskeletal:     Cervical back: Neck supple.     Comments: Walked into the urgent care independently  Skin:    General: Skin is warm and dry.     Comments: No cyanosis Scattered excoriated patches, some with mild erythema, in the infra gluteal folds, the flexor crease of the anterior hips, near the flexor crease of the elbows.  There is some bruising on the anterior thighs that the patient attributes to scratching.  Neurological:     Mental Status: She is alert.     Comments: Face symmetric, speech clear, coherent, logical Voice does have a slightly hot potato quality      UC Treatments / Results  Labs (all labs ordered are listed, but only abnormal results are displayed) Labs Reviewed  HSV CULTURE AND TYPING  GROUP A STREP BY PCR    EKG   Radiology No results found.  Procedures Procedures (including critical care time)  Medications Ordered in UC  Medications -  No data to display  Initial Impression / Assessment and Plan / UC Course  I have reviewed the triage vital signs and the nursing notes.  Pertinent labs & imaging results that were available during my care of the patient were reviewed by me and considered in my medical decision making (see chart for details).     *** Final Clinical Impressions(s) / UC Diagnoses   Final diagnoses:  Stomatitis     Discharge Instructions      Potential causes of persistent severe mouth ulcers and sore throat include herpes virus infection, which may accompany HIV infection, thrush (yeast) infection, less likely bacterial causes like strep and irritation from stomach acid.  Swab for viral infection was done today along with strep swab.  The urgent ill contact you if a change in treatment is needed based on results.  Prescriptions for fluconazole (yeast medicine) and famciclovir (herpes medicine) were sent to the pharmacy.  Please followup at the HIV clinic in [place] for further testing.  A PCP will be helpful for ongoing care needs.    ED Prescriptions     Medication Sig Dispense Auth. Provider   fluconazole (DIFLUCAN) 200 MG tablet Take 1 tablet (200 mg total) by mouth daily for 7 days. 7 tablet Isa Rankin, MD   famciclovir [facility]) 500 MG tablet Take 1 tablet (500 mg total) by mouth 2 (two) times daily for 10 days. 20 tablet Isa Rankin, MD      PDMP not reviewed this encounter.

## 2022-01-31 NOTE — ED Triage Notes (Signed)
Pt was seen 01/22/22 for oral thrush and it is not better.

## 2022-01-31 NOTE — Discharge Instructions (Addendum)
Potential causes of persistent severe mouth ulcers and sore throat include herpes virus infection, which may accompany HIV infection, thrush (yeast) infection, less likely bacterial causes like strep and irritation from stomach acid.  Swab for viral infection was done today along with strep swab.  The urgent ill contact you if a change in treatment is needed based on results.  Prescriptions for fluconazole (yeast medicine) and famciclovir (herpes medicine) were sent to the pharmacy.  Please followup at the HIV clinic in [place] for further testing.  A PCP will be helpful for ongoing care needs.

## 2022-02-02 ENCOUNTER — Ambulatory Visit: Payer: Self-pay | Admitting: Advanced Practice Midwife

## 2022-02-02 DIAGNOSIS — R569 Unspecified convulsions: Secondary | ICD-10-CM

## 2022-02-02 DIAGNOSIS — B370 Candidal stomatitis: Secondary | ICD-10-CM | POA: Insufficient documentation

## 2022-02-02 DIAGNOSIS — F319 Bipolar disorder, unspecified: Secondary | ICD-10-CM | POA: Insufficient documentation

## 2022-02-02 DIAGNOSIS — T7421XA Adult sexual abuse, confirmed, initial encounter: Secondary | ICD-10-CM | POA: Insufficient documentation

## 2022-02-02 DIAGNOSIS — T7421XS Adult sexual abuse, confirmed, sequela: Secondary | ICD-10-CM

## 2022-02-02 DIAGNOSIS — T7412XS Child physical abuse, confirmed, sequela: Secondary | ICD-10-CM

## 2022-02-02 DIAGNOSIS — F1410 Cocaine abuse, uncomplicated: Secondary | ICD-10-CM

## 2022-02-02 DIAGNOSIS — Z113 Encounter for screening for infections with a predominantly sexual mode of transmission: Secondary | ICD-10-CM

## 2022-02-02 DIAGNOSIS — T7412XA Child physical abuse, confirmed, initial encounter: Secondary | ICD-10-CM | POA: Insufficient documentation

## 2022-02-02 DIAGNOSIS — Z62810 Personal history of physical and sexual abuse in childhood: Secondary | ICD-10-CM

## 2022-02-02 HISTORY — DX: Bipolar disorder, unspecified: F31.9

## 2022-02-02 LAB — WET PREP FOR TRICH, YEAST, CLUE
Trichomonas Exam: NEGATIVE
Yeast Exam: NEGATIVE

## 2022-02-02 LAB — HSV CULTURE AND TYPING

## 2022-02-02 LAB — HM HIV SCREENING LAB: HM HIV Screening: NEGATIVE

## 2022-02-02 LAB — HM HEPATITIS C SCREENING LAB: HM Hepatitis Screen: NEGATIVE

## 2022-02-02 NOTE — Progress Notes (Signed)
Patients  wet prep reviewed, no tx per standing orders.

## 2022-02-02 NOTE — Progress Notes (Signed)
[facility] Department  STI clinic/screening visit Taylor Oneill [id number] [phone]  Subjective:  Taylor Oneill is a 26 y.o. SWF [id number] exsmoker female being seen today for an STI screening visit. The patient reports they do have symptoms.  Patient reports that they do not desire a pregnancy in the next year.   They reported they are not interested in discussing contraception today.    Patient's last menstrual period was 12/26/2021 (approximate).   Patient has the following medical conditions:   Patient Active Problem List   Diagnosis Date Noted   Major depressive disorder, recurrent episode, mild ([place]) 11/19/2018   Panic disorder 08/07/2018    Chief Complaint  Patient presents with   SEXUALLY TRANSMITTED DISEASE    Screening    HPI  Patient reports sent here by urgent care because she still has thrush despite antifungal tx. Had endoscopy 01/30/22 because has lost 40 lbs in last month and throat hurts "they think I might have herpes in my throat because it hurts to eat". Currently on Flagyl BID x 10 days and Diflucan "for the stuff in my throat". Mirena inserted 05/2019. Last sex 12/01/21 without condom; with current partner x 8 mo; 1 partner in last 3 mo. LMP ?Marland Kitchen Hospitalized 06/2018 x 6 wks in [facility] and dx'd with Bipolar, PTSD, anxiety, depression. Just lost her Medicaid so lost her counselor. Last cig 4 years ago. Last vaped 11/2021. Last ETOH 12/2021 (2 mixed drinks) 1x/mo. Last MJ 3 mo ago. Last cocaine 02/2021. Last mushrooms 11/2021.  Last HIV test per patient/review of record was 11/11/18 Patient reports last pap was 01/13/22 wnl per pt  Screening for MPX risk: Does the patient have an unexplained rash? No Is the patient MSM? No Does the patient endorse multiple sex partners or anonymous sex partners? No Did the patient have close or sexual contact with a person diagnosed with MPX? No Has the patient traveled outside the [place]  where MPX is endemic? No Is there a high clinical suspicion for MPX-- evidenced by one of the following No  -Unlikely to be chickenpox  -Lymphadenopathy  -Rash that present in same phase of evolution on any given body part See flowsheet for further details and programmatic requirements.   Immunization history:  Immunization History  Administered Date(s) Administered   Hepatitis A 11/14/2006, 08/31/2010   Meningococcal Conjugate 11/14/2006   Meningococcal Mcv4o 02/25/2014   Td 08/09/2016   Tdap 01/27/2016   Varicella 11/14/2006, 11/08/2012     The following portions of the patient's history were reviewed and updated as appropriate: allergies, current medications, past medical history, past social history, past surgical history and problem list.  Objective:  There were no vitals filed for this visit.  Physical Exam Vitals and nursing note reviewed.  Constitutional:      Appearance: Normal appearance.  HENT:     Head: Normocephalic and atraumatic.     Mouth/Throat:     Mouth: Mucous membranes are moist.     Pharynx: Oropharynx is clear. No oropharyngeal exudate or posterior oropharyngeal erythema.  Eyes:     Conjunctiva/sclera: Conjunctivae normal.  Pulmonary:     Effort: Pulmonary effort is normal.  Abdominal:     Palpations: Abdomen is soft. There is no mass.     Tenderness: There is no abdominal tenderness. There is no rebound.     Comments: Soft without masses or tenderness, fair tone  Genitourinary:    General: Normal vulva.  Exam position: Lithotomy position.     Pubic Area: No rash or pubic lice.      Labia:        Right: No rash or lesion.        Left: No rash or lesion.      Vagina: Vaginal discharge (white creamy leukorrhea, ph<4.5) present. No erythema, bleeding or lesions.     Cervix: Normal.     Uterus: Normal.      Adnexa: Right adnexa normal and left adnexa normal.     Rectum: Normal.     Comments: pH = <4.5 Lymphadenopathy:     Head:     Right  side of head: No preauricular or posterior auricular adenopathy.     Left side of head: No preauricular or posterior auricular adenopathy.     Cervical: No cervical adenopathy.     Right cervical: No superficial, deep or posterior cervical adenopathy.    Left cervical: No superficial, deep or posterior cervical adenopathy.     Upper Body:     Right upper body: No supraclavicular, axillary or epitrochlear adenopathy.     Left upper body: No supraclavicular, axillary or epitrochlear adenopathy.     Lower Body: No right inguinal adenopathy. No left inguinal adenopathy.  Skin:    General: Skin is warm and dry.     Findings: No rash.  Neurological:     Mental Status: She is alert and oriented to person, place, and time.     Assessment and Plan:  Taylor Oneill is a 26 y.o. female presenting to the [facility] Department for STI screening  1. Screening examination for venereal disease Please give pt contact info for Milton Ferguson, LCSW Treat wet mount per standing orders Immunization nurse consult   - WET PREP FOR [place], YEAST, CLUE - Syphilis Serology, [place] [facility] - HIV/HCV [place] [facility] - Chlamydia/Gonorrhea [place] [facility] - Gonococcus culture     No follow-ups on file.  Future Appointments  Date Time Provider [place]  03/09/2022  2:40 PM Theresia Lo, NP [facility] None    Herbie Saxon, CNM

## 2022-02-06 LAB — GONOCOCCUS CULTURE

## 2022-02-16 ENCOUNTER — Ambulatory Visit: Payer: Self-pay | Admitting: Licensed Clinical Social Worker

## 2022-02-16 DIAGNOSIS — F411 Generalized anxiety disorder: Secondary | ICD-10-CM | POA: Insufficient documentation

## 2022-02-16 DIAGNOSIS — F331 Major depressive disorder, recurrent, moderate: Secondary | ICD-10-CM | POA: Insufficient documentation
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
## 2022-03-02 ENCOUNTER — Ambulatory Visit: Payer: Self-pay | Admitting: Licensed Clinical Social Worker

## 2022-03-02 DIAGNOSIS — F411 Generalized anxiety disorder: Secondary | ICD-10-CM

## 2022-03-02 DIAGNOSIS — F331 Major depressive disorder, recurrent, moderate: Secondary | ICD-10-CM

## 2022-03-02 NOTE — Progress Notes (Signed)
Counselor/Therapist Progress Note  Patient ID: Taylor Oneill, MRN: [id number],    Date: 03/02/2022  Time Spent: 60 minutes   Treatment Type: Psychotherapy  Reported Symptoms:  low mood, low motivation; anxiety, anxious thoughts, social anxiety, sleep disturbance    Mental Status Exam:  Appearance:   Casual     Behavior:  Appropriate and Sharing  Motor:  Normal  Speech/Language:   Clear and Coherent and Normal Rate  Affect:  Appropriate, Congruent, and Full Range  Mood:  normal  Thought process:  normal  Thought content:    WNL  Sensory/Perceptual disturbances:    WNL  Orientation:  oriented to person, place, time/date, and situation  Attention:  Good  Concentration:  Good  Memory:  WNL  Fund of knowledge:   Good  Insight:    Fair  Judgment:   Fair  Impulse Control:  Fair   Risk Assessment: Danger to Self:  No Self-injurious Behavior: No Danger to Others: No Duty to Warn:no Physical Aggression / Violence:No  Access to Firearms a concern: No  Gang Involvement:No   Subjective: Patient was engaged and cooperative throughout the session using time effectively to discuss previous session and treatment strategies and plan. Patient voices motivation for treatment and understanding of CBTs. Patient is likely to benefit from future treatment because she is motivated to decrease symptoms.      Interventions: Cognitive Behavioral Therapy and Mindfulness Meditation  Checked in with patient and reviewed previous session, including assessment and goal of treatment. Reviewed CBTs. Explored patient's goal of treatment and worked collaboratively to develop CBTs treatment plan. Provided support through active listening, validation of feelings, and highlighted patient's strengths.   Diagnosis:   ICD-10-CM   1. Generalized anxiety disorder  F41.1     2. Moderate episode of recurrent major depressive disorder (HCC)  F33.1      Plan:  Patient's goal of treatment is to get better at  regulating her emotions.   Treatment Target: Understand the relationship between thoughts, emotions, and behaviors  Psychoeducation on CBT model   Oriented the client to the therapeutic approach Teach the connection between thoughts, emotions, and behaviors   Treatment Target: Increase realistic balanced thinking -to learn how to replace thinking with thoughts that are more accurate or helpful Explore patient's thoughts, beliefs, automatic thoughts, assumptions  Identify and replace unhelpful thinking patterns (upsetting ideas, self-talk and mental images) Process distress and allow for emotional release  Questioning and challenging thoughts Cognitive reappraisal  Restructuring, Socratic questioning   Treatment Target: Reducing vulnerability to "emotional mind" and increase mood regulation  Radical Acceptance  Values clarification   Self-care - nutrition, sleep, exercise  Mindfulness Approaches   Future Appointments  Date Time Provider [place]  03/09/2022  2:40 PM Theresia Lo, NP [facility] None  03/16/2022  2:30 PM Milton Ferguson, LCSW [facility] None    Milton Ferguson, LCSW

## 2022-03-09 ENCOUNTER — Ambulatory Visit (INDEPENDENT_AMBULATORY_CARE_PROVIDER_SITE_OTHER): Payer: Self-pay | Admitting: Nurse Practitioner

## 2022-03-09 ENCOUNTER — Encounter: Payer: Self-pay | Admitting: Nurse Practitioner

## 2022-03-09 VITALS — BP 134/81 | HR 72 | Ht 67.0 in | Wt 188.6 lb

## 2022-03-09 DIAGNOSIS — Z23 Encounter for immunization: Secondary | ICD-10-CM

## 2022-03-09 DIAGNOSIS — E663 Overweight: Secondary | ICD-10-CM

## 2022-03-09 DIAGNOSIS — R42 Dizziness and giddiness: Secondary | ICD-10-CM

## 2022-03-09 DIAGNOSIS — Z131 Encounter for screening for diabetes mellitus: Secondary | ICD-10-CM

## 2022-03-09 DIAGNOSIS — F419 Anxiety disorder, unspecified: Secondary | ICD-10-CM

## 2022-03-09 LAB — POCT GLYCOSYLATED HEMOGLOBIN (HGB A1C): HbA1c POC (<> result, manual entry): 5.3 % (ref 4.0–5.6)

## 2022-03-09 LAB — POCT URINALYSIS DIPSTICK
Appearance: NORMAL
Bilirubin, UA: NEGATIVE
Blood, UA: NEGATIVE
Glucose, UA: NEGATIVE
Ketones, UA: NEGATIVE
Leukocytes, UA: NEGATIVE
Nitrite, UA: NEGATIVE
Protein, UA: NEGATIVE
Spec Grav, UA: <=1.005 — AB (ref 1.010–1.025)
Urobilinogen, UA: NEGATIVE E.U./dL — AB
pH, UA: 7 (ref 5.0–8.0)

## 2022-03-09 LAB — GLUCOSE, POCT (MANUAL RESULT ENTRY): POC Glucose: 108 mg/dl — AB (ref 70–99)
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
## 2022-03-14 DIAGNOSIS — F419 Anxiety disorder, unspecified: Secondary | ICD-10-CM | POA: Insufficient documentation

## 2022-03-14 DIAGNOSIS — E663 Overweight: Secondary | ICD-10-CM | POA: Insufficient documentation

## 2022-03-14 DIAGNOSIS — R42 Dizziness and giddiness: Secondary | ICD-10-CM | POA: Insufficient documentation

## 2022-03-14 NOTE — Assessment & Plan Note (Signed)
>>  ASSESSMENT AND PLAN FOR ANXIETY WRITTEN ON 03/14/2022  9:54 PM BY Kara Dies, NP  GAD score 20. She does not want to restart her medication at present. Advised patient to perform  deep breathing exercises, meditation and journaling. We will continue to monitor

## 2022-03-14 NOTE — Assessment & Plan Note (Addendum)
Urinalysis shows specific gravity on the lower side. Advised patient to cut down on fluid intake and no more than 3 L a day Overhydration can lead to water intoxication. Patient does not have insurance at present so she would like to hold on labs at present.

## 2022-03-14 NOTE — Assessment & Plan Note (Signed)
GAD score 20. She does not want to restart her medication at present. Advised patient to perform  deep breathing exercises, meditation and journaling. We will continue to monitor

## 2022-03-14 NOTE — Assessment & Plan Note (Signed)
Body mass index is 29.54 kg/m. Advised pt to lose weight. Advised patient to avoid trans fat, fatty and fried food. Follow a regular physical activity schedule.

## 2022-03-16 ENCOUNTER — Ambulatory Visit: Payer: Self-pay | Admitting: Licensed Clinical Social Worker

## 2022-03-16 DIAGNOSIS — F411 Generalized anxiety disorder: Secondary | ICD-10-CM

## 2022-03-16 DIAGNOSIS — F330 Major depressive disorder, recurrent, mild: Secondary | ICD-10-CM

## 2022-03-16 NOTE — Progress Notes (Signed)
Counselor/Therapist Progress Note  Patient ID: Taylor Oneill, MRN: [id number],    Date: 03/16/2022  Time Spent: 50 minutes   Treatment Type: Psychotherapy  Reported Symptoms:  low mood, anxiety, anxious thoughts  Mental Status Exam:  Appearance:   Casual     Behavior:  Appropriate, Sharing, and Motivated  Motor:  Normal  Speech/Language:   Clear and Coherent and Normal Rate  Affect:  Appropriate  Mood:  normal  Thought process:  normal  Thought content:    WNL  Sensory/Perceptual disturbances:    WNL  Orientation:  oriented to person, place, time/date, and situation  Attention:  Good  Concentration:  Good  Memory:  WNL  Fund of knowledge:   Good  Insight:    Fair  Judgment:   Fair  Impulse Control:  Fair   Risk Assessment: Danger to Self:  No Self-injurious Behavior: No Danger to Others: No Duty to Warn:no Physical Aggression / Violence:No  Access to Firearms a concern: No  Gang Involvement:No   Subjective: Patient was engaged and cooperative throughout the session using time effectively to discuss thoughts, feelings and engaged in mindfulness exercise.  Patient voices understanding of mindfulness and contracted to practice a few times a week. She reports continued motivation for treatment.  Interventions: Cognitive Behavioral Therapy LCSW checked in with client about her week and her mood. Engaged patient in processing thoughts and feelings related to current stressors at work, not being where she would like in life, challenges with ruminating thoughts, difficulties getting phone calls accomplished due to anxiety. LCSW taught patient about cultivating the felt sense of gratitude, provided psychoeducation on mindfulness, engaged patient in mindfulness exercise, processed exercise, and contracted with patient to complete daily. Provided support through active listening, validation of feelings, and highlighted patient's strengths.    Diagnosis:   ICD-10-CM   1.  Generalized anxiety disorder  F41.1     2. Major depressive disorder, recurrent episode, mild ([place])  F33.0      Plan: Patient's goal of treatment is to get better at regulating her emotions.    Treatment Target: Understand the relationship between thoughts, emotions, and behaviors  Psychoeducation on CBT model   Oriented the client to the therapeutic approach Teach the connection between thoughts, emotions, and behaviors    Treatment Target: Increase realistic balanced thinking -to learn how to replace thinking with thoughts that are more accurate or helpful Explore patient's thoughts, beliefs, automatic thoughts, assumptions  Identify and replace unhelpful thinking patterns (upsetting ideas, self-talk and mental images) Process distress and allow for emotional release  Questioning and challenging thoughts Cognitive reappraisal  Restructuring, Socratic questioning    Treatment Target: Reducing vulnerability to "emotional mind" and increase mood regulation  Radical Acceptance  Values clarification   Self-care - nutrition, sleep, exercise  Mindfulness Approaches   Future Appointments  Date Time Provider [place]  04/05/2022  1:00 PM Milton Ferguson, LCSW [facility] None    Milton Ferguson, LCSW

## 2022-04-05 ENCOUNTER — Ambulatory Visit: Payer: Self-pay | Admitting: Licensed Clinical Social Worker

## 2022-04-05 DIAGNOSIS — F411 Generalized anxiety disorder: Secondary | ICD-10-CM

## 2022-04-05 DIAGNOSIS — F330 Major depressive disorder, recurrent, mild: Secondary | ICD-10-CM

## 2022-04-05 DIAGNOSIS — F410 Panic disorder [episodic paroxysmal anxiety] without agoraphobia: Secondary | ICD-10-CM

## 2022-04-05 NOTE — Progress Notes (Signed)
Counselor/Therapist Progress Note  Patient ID: Taylor Oneill, MRN: [id number],    Date: 04/05/2022  Time Spent: 50 minutes   Treatment Type: Psychotherapy  Reported Symptoms:  Low mood, passive suicidal ideation, no intent or plan; loneliness  Mental Status Exam:  Appearance:   Casual     Behavior:  Appropriate, Sharing, and Motivated  Motor:  Normal  Speech/Language:   Clear and Coherent and Normal Rate  Affect:  Appropriate, Congruent, and Tearful  Mood:  sad  Thought process:  normal  Thought content:    WNL  Sensory/Perceptual disturbances:    WNL  Orientation:  oriented to person, place, time/date, and situation  Attention:  Good  Concentration:  Good  Memory:  WNL  Fund of knowledge:   Good  Insight:    Fair  Judgment:   Fair  Impulse Control:  Fair   Risk Assessment: Danger to Self:  No Self-injurious Behavior: No Danger to Others: No Duty to Warn:no Physical Aggression / Violence:No  Access to Firearms a concern: No  Gang Involvement:No   Subjective: Patient was receptive to feedback and intervention from LCSW and actively and effectively participated throughout the session. Patient voiced completing homework and benefiting from [place] guided meditation. She repost she has started back on Lamictal for seizures and is hopeful it will also level out her mood once she titrate's back to appropriate dose.   Interventions: Cognitive Behavioral Therapy and client centered Checked in with patient regarding their week. Explored patient's experience of loneliness and isolation, identifying the need and barriers to patient to increase social interactions. To address barriers (issues with transportation and lack of follow through due to anxiety) LCSW taught patient Mindfulness acceptance of emotions showing up- observing, without judgement. Assessed patient for safety. Reviewed previous session mindfulness homework. Provided support through active listening,  validation of feelings, and highlighted patient's strengths.    Diagnosis:   ICD-10-CM   1. Generalized anxiety disorder  F41.1     2. Major depressive disorder, recurrent episode, mild ([facility])  F33.0     3. Panic disorder  F41.0      Plan: Patient's goal of treatment is to get better at regulating her emotions.    Treatment Target: Understand the relationship between thoughts, emotions, and behaviors  Psychoeducation on CBT model   Oriented the client to the therapeutic approach Teach the connection between thoughts, emotions, and behaviors    Treatment Target: Increase realistic balanced thinking -to learn how to replace thinking with thoughts that are more accurate or helpful Explore patient's thoughts, beliefs, automatic thoughts, assumptions  Identify and replace unhelpful thinking patterns (upsetting ideas, self-talk and mental images) Process distress and allow for emotional release  Questioning and challenging thoughts Cognitive reappraisal  Restructuring, Socratic questioning    Treatment Target: Reducing vulnerability to "emotional mind" and increase mood regulation  Radical Acceptance  Values clarification   Self-care - nutrition, sleep, exercise  Mindfulness Approaches   Future Appointments  Date Time Provider Department Center  04/13/2022  1:00 PM Kathreen Cosier, LCSW [facility] None    Kathreen Cosier, LCSW

## 2022-04-13 ENCOUNTER — Ambulatory Visit: Payer: Self-pay | Admitting: Licensed Clinical Social Worker

## 2022-04-13 NOTE — Progress Notes (Unsigned)
Counselor/Therapist Progress Note  Patient ID: IMONI KOHEN, MRN: [id number],    Date: 04/13/2022  Time Spent: ***   Treatment Type: Psychotherapy  Reported Symptoms: {CHL AMB Reported Symptoms:[phone]}  Mental Status Exam:  Appearance:   {PSY:22683}     Behavior:  {PSY:21022743}  Motor:  {PSY:22302}  Speech/Language:   {PSY:22685}  Affect:  {PSY:22687}  Mood:  {PSY:31886}  Thought process:  {PSY:31888}  Thought content:    {[phone]}  Sensory/Perceptual disturbances:    {PSY:907 150 1833}  Orientation:  {PSY:30297}  Attention:  {PSY:22877}  Concentration:  {PSY:5855372457}  Memory:  {[phone]}  Fund of knowledge:   {PSY:5855372457}  Insight:    {PSY:5855372457}  Judgment:   {PSY:5855372457}  Impulse Control:  {PSY:5855372457}   Risk Assessment: Danger to Self:  {PSY:22692} Self-injurious Behavior: {PSY:22692} Danger to Others: {PSY:22692} Duty to Warn:{PSY:311194} Physical Aggression / Violence:{PSY:21197} Access to Firearms a concern: {PSY:21197} Gang Involvement:{PSY:21197}  Subjective: Patient was engaged and cooperative throughout the session using time effectively to discuss   Patient voices continued motivation for treatment and understanding of  . Patient is likely to benefit from future treatment because  remains motivated to decrease  And   and reports benefit of regular sessions in addressing these symptoms.     Interventions: {[phone]} Checked in with patient regarding their week. LCSW assisted patient in processing their emotions about what they have experienced in ... and ... with / at .... LCSW reviewed behavior modification, distress tolerance and effective communication skills with patient. Provided support through active listening, validation of feelings, and highlighted patient's strengths.     Diagnosis:No diagnosis found.  Plan: Patient's goal of treatment is to get better at regulating her emotions.    Treatment  Target: Understand the relationship between thoughts, emotions, and behaviors  Psychoeducation on CBT model   Oriented the client to the therapeutic approach Teach the connection between thoughts, emotions, and behaviors    Treatment Target: Increase realistic balanced thinking -to learn how to replace thinking with thoughts that are more accurate or helpful Explore patient's thoughts, beliefs, automatic thoughts, assumptions  Identify and replace unhelpful thinking patterns (upsetting ideas, self-talk and mental images) Process distress and allow for emotional release  Questioning and challenging thoughts Cognitive reappraisal  Restructuring, Socratic questioning    Treatment Target: Reducing vulnerability to "emotional mind" and increase mood regulation  Radical Acceptance  Values clarification   Self-care - nutrition, sleep, ex  Future Appointments  Date Time Provider Department Center  04/13/2022  1:00 PM Kathreen Cosier, LCSW [facility] None    Kathreen Cosier, LCSW

## 2022-04-20 ENCOUNTER — Ambulatory Visit: Payer: Self-pay | Admitting: Licensed Clinical Social Worker

## 2022-04-20 DIAGNOSIS — F411 Generalized anxiety disorder: Secondary | ICD-10-CM

## 2022-04-20 DIAGNOSIS — F330 Major depressive disorder, recurrent, mild: Secondary | ICD-10-CM

## 2022-04-20 NOTE — Progress Notes (Signed)
Counselor/Therapist Progress Note  Patient ID: EOWYN TABONE, MRN: [id number],    Date: 04/20/2022  Time Spent:  50 minutes  Treatment Type: Psychotherapy  Reported Symptoms:  Some decrease in symptoms, continued anxiety, anxious thoughts, and intermittent sadness and low mood    Mental Status Exam:  Appearance:   Neat and Well Groomed     Behavior:  Appropriate, Sharing, and Motivated  Motor:  Normal  Speech/Language:   Clear and Coherent and Normal Rate  Affect:  Appropriate, Congruent, and Full Range  Mood:  euthymic  Thought process:  normal  Thought content:    WNL  Sensory/Perceptual disturbances:    WNL  Orientation:  oriented to person, place, time/date, situation, and day of week  Attention:  Good  Concentration:  Good  Memory:  WNL  Fund of knowledge:   Good  Insight:    Fair  Judgment:   Fair  Impulse Control:  Fair   Risk Assessment: Danger to Self:  No Self-injurious Behavior: No Danger to Others: No Duty to Warn:no Physical Aggression / Violence:No  Access to Firearms a concern: No  Gang Involvement:No   Subjective: Patient was engaged and cooperative throughout the session using time effectively to discuss thoughts, feelings, and coping skills. Patient was receptive to feedback and intervention from LCSW. Patient voices some improvement in symptoms and benefit from engaging with friends. Patient is likely to benefit from future treatment because she remains motivated to decrease symptoms and reports benefit of regular sessions.        Interventions: Cognitive Behavioral Therapy and Client centered  Checked in with client about her week. Engaged client in discussing thoughts and feelings related to psychosocial stressors over the past week and related to unresolved childhood issues and challenges related to where she is in her life at this time. LCSW reviewed mindfulness strategies with patient to help regulate her nervous system, discussed trauma  triggers, reframed unhelpful thoughts and taught patient Radical Acceptance. Provided support through active listening, validation of feelings, and highlighted patient's strengths.    Diagnosis:   ICD-10-CM   1. Generalized anxiety disorder  F41.1     2. Major depressive disorder, recurrent episode, mild ([facility])  F33.0      Plan: Patient's goal of treatment is to get better at regulating her emotions.    Treatment Target: Understand the relationship between thoughts, emotions, and behaviors  Psychoeducation on CBT model   Oriented the client to the therapeutic approach Teach the connection between thoughts, emotions, and behaviors    Treatment Target: Increase realistic balanced thinking -to learn how to replace thinking with thoughts that are more accurate or helpful Explore patient's thoughts, beliefs, automatic thoughts, assumptions  Identify and replace unhelpful thinking patterns (upsetting ideas, self-talk and mental images) Process distress and allow for emotional release  Questioning and challenging thoughts Cognitive reappraisal  Restructuring, Socratic questioning    Treatment Target: Reducing vulnerability to "emotional mind" and increase mood regulation  Radical Acceptance  Values clarification   Self-care - nutrition, sleep, exercise  Mindfulness Approaches   Future Appointments  Date Time Provider Department Center  05/04/2022  1:00 PM Kathreen Cosier, LCSW [facility] None     Kathreen Cosier, LCSW

## 2022-05-04 ENCOUNTER — Ambulatory Visit: Payer: Self-pay | Admitting: Licensed Clinical Social Worker

## 2022-05-18 ENCOUNTER — Ambulatory Visit: Payer: Self-pay | Admitting: Licensed Clinical Social Worker

## 2022-05-18 DIAGNOSIS — F330 Major depressive disorder, recurrent, mild: Secondary | ICD-10-CM

## 2022-05-18 DIAGNOSIS — F411 Generalized anxiety disorder: Secondary | ICD-10-CM

## 2022-05-18 NOTE — Progress Notes (Signed)
Counselor/Therapist Progress Note  Patient ID: Taylor Oneill, MRN: [id number],    Date: 05/18/2022  Time Spent: 60 minutes   Treatment Type: Psychotherapy  Reported Symptoms:  Better mood overall; continued anxiety, ruminating thoughts, intermittent sadness   Mental Status Exam:  Appearance:   Casual     Behavior:  Appropriate, Sharing, and Motivated  Motor:  Normal  Speech/Language:   Clear and Coherent and Normal Rate  Affect:  Appropriate, Congruent, and Full Range  Mood:  normal  Thought process:  normal  Thought content:    WNL  Sensory/Perceptual disturbances:    WNL  Orientation:  oriented to person, place, time/date, situation, and day of week  Attention:  Good  Concentration:  Good  Memory:  WNL  Fund of knowledge:   Good  Insight:    Fair  Judgment:   Fair  Impulse Control:  Fair   Risk Assessment: Danger to Self:  No Self-injurious Behavior: No Danger to Others: No Duty to Warn:no Physical Aggression / Violence:No  Access to Firearms a concern: No  Gang Involvement:No   Subjective: Patient was engaged and cooperative throughout the session using time effectively to discuss thoughts and feelings. Patient voices overall mood improvement and increased engagement with friends which she finds beneficial. Patient reported benefit from mindfulness exercise. She reports continued motivation for treatment and understanding of mood and anxiety issues. Patient is likely to benefit from future treatment because she remains motivated to decrease symptoms and improve functioning and reports benefit of regular sessions in addressing symptoms.   Interventions: Cognitive Behavioral Therapy Checked in with patient regarding mood and current psychosocial stressors. LCSW explored patient's perception of what has been going well and what she has struggled with over the last month. LCSW and patient chained out thoughts, emotions, and behaviors identifying points of intervention.  LCSW highlighted and challenges patient's unhelpful thoughts and discussed self-invalidation.LCSW encouraged patient to notice this invalidation and to reframe these thoughts. LCSW lead patient in mindfulness exercise. Provided support through active listening, validation of feelings, and highlighted patient's strengths.    Diagnosis:   ICD-10-CM   1. Generalized anxiety disorder  F41.1     2. Major depressive disorder, recurrent episode, mild ([place])  F33.0      Plan:  Patient's goal of treatment is to get better at regulating her emotions.    Treatment Target: Understand the relationship between thoughts, emotions, and behaviors  Psychoeducation on CBT model   Oriented the client to the therapeutic approach Teach the connection between thoughts, emotions, and behaviors    Treatment Target: Increase realistic balanced thinking -to learn how to replace thinking with thoughts that are more accurate or helpful Explore patient's thoughts, beliefs, automatic thoughts, assumptions  Identify and replace unhelpful thinking patterns (upsetting ideas, self-talk and mental images) Process distress and allow for emotional release  Questioning and challenging thoughts Cognitive reappraisal  Restructuring, Socratic questioning    Treatment Target: Reducing vulnerability to "emotional mind" and increase mood regulation  Radical Acceptance  Values clarification   Self-care - nutrition, sleep, exercise  Mindfulness Approaches   Future Appointments  Date Time Provider [place]  06/01/2022  2:00 PM Milton Ferguson, LCSW [facility] None   Milton Ferguson, LCSW

## 2022-06-01 ENCOUNTER — Ambulatory Visit: Payer: Self-pay | Admitting: Licensed Clinical Social Worker

## 2022-06-05 ENCOUNTER — Telehealth: Payer: Self-pay | Admitting: Licensed Clinical Social Worker

## 2022-06-05 NOTE — Telephone Encounter (Signed)
Attempted call to patient to reschedule cancelled appt. From last week due to emergency absence of LCSW. LCSW lvm.

## 2022-06-22 ENCOUNTER — Ambulatory Visit: Payer: 59 | Admitting: Licensed Clinical Social Worker

## 2022-06-22 DIAGNOSIS — F411 Generalized anxiety disorder: Secondary | ICD-10-CM

## 2022-06-22 DIAGNOSIS — F330 Major depressive disorder, recurrent, mild: Secondary | ICD-10-CM

## 2022-06-22 NOTE — Progress Notes (Signed)
Counselor/Therapist Progress Note  Patient ID: Taylor Oneill, MRN: [id number],    Date: 06/22/2022  Time Spent: 45 minutes   Treatment Type: Psychotherapy  Reported Symptoms:  intermittent depressive symptoms, low mood, low motivation, low appetite; anxiety, anxious thoughts    Mental Status Exam:  Appearance:   Casual and Well Groomed     Behavior:  Appropriate, Sharing, and Motivated  Motor:  Normal  Speech/Language:   Clear and Coherent and Normal Rate  Affect:  Appropriate, Congruent, and Full Range  Mood:  normal  Thought process:  normal  Thought content:    WNL  Sensory/Perceptual disturbances:    WNL  Orientation:  oriented to person, place, time/date, situation, and day of week  Attention:  Good  Concentration:  Good  Memory:  WNL  Fund of knowledge:   Good  Insight:    Fair  Judgment:   Fair  Impulse Control:  Fair   Risk Assessment: Danger to Self:  No Self-injurious Behavior: No Danger to Others: No Duty to Warn:no Physical Aggression / Violence:No  Access to Firearms a concern: No  Gang Involvement:No   Subjective: Patient was engaged and cooperative throughout the session using time to discuss thoughts, feelings, and coping skills. Patient voices intermittent depressive symptoms. Patient was receptive to feedback and intervention from LCSW.  Interventions: Cognitive Behavioral Therapy Checked in with patient regarding their week. Clinician met with patient to identify needs related to stressors and functioning, and assess and monitor for signs and symptoms of depression and anxiety and assess safety. The clinician processed with the patient how they have been doing since the last follow-up session. LCSW charted out patient's thoughts, emotions and behaviors identifying unhelpful thoughts and challenging and reframing these thoughts leading to distress and encouraging patient to notice her emotions. Discussed self-compassion, judgement and becoming aware of  her own self-care needs, including times when she needs to just spend time in bed for the day and giving herself nonjudgmental permission to do this.  Provided support through active listening, validation of feelings, and highlighted patient's strengths.   Diagnosis:   ICD-10-CM   1. Generalized anxiety disorder  F41.1     2. Major depressive disorder, recurrent episode, mild ([place])  F33.0      Plan: Patient's goal of treatment is to get better at regulating her emotions.    Treatment Target: Understand the relationship between thoughts, emotions, and behaviors  Psychoeducation on CBT model   Oriented the client to the therapeutic approach Teach the connection between thoughts, emotions, and behaviors    Treatment Target: Increase realistic balanced thinking -to learn how to replace thinking with thoughts that are more accurate or helpful Explore patient's thoughts, beliefs, automatic thoughts, assumptions  Identify and replace unhelpful thinking patterns (upsetting ideas, self-talk and mental images) Process distress and allow for emotional release  Questioning and challenging thoughts Cognitive reappraisal  Restructuring, Socratic questioning    Treatment Target: Reducing vulnerability to "emotional mind" and increase mood regulation  Radical Acceptance  Values clarification   Self-care - nutrition, sleep, exercise  Mindfulness Approaches   Future Appointments  Date Time Provider [place]  07/06/2022  1:50 PM Milton Ferguson, LCSW [facility] None     Milton Ferguson, LCSW

## 2022-07-06 ENCOUNTER — Ambulatory Visit: Payer: 59 | Admitting: Licensed Clinical Social Worker

## 2022-07-06 DIAGNOSIS — F330 Major depressive disorder, recurrent, mild: Secondary | ICD-10-CM

## 2022-07-06 DIAGNOSIS — F411 Generalized anxiety disorder: Secondary | ICD-10-CM

## 2022-07-06 NOTE — Progress Notes (Signed)
Counselor/Therapist Progress Note  Patient ID: Taylor Oneill, MRN: [id number],    Date: 07/06/2022  Time Spent: 45 minutes   Treatment Type: Psychotherapy  Reported Symptoms:  Intermittent sadness, low mood; anxiety, anxious thoughts   Mental Status Exam:  Appearance:   Casual and Well Groomed     Behavior:  Appropriate, Sharing, and Motivated  Motor:  Normal  Speech/Language:   Clear and Coherent and Normal Rate  Affect:  Appropriate, Congruent, and Full Range  Mood:  sad  Thought process:  normal  Thought content:    WNL  Sensory/Perceptual disturbances:    WNL  Orientation:  oriented to person, place, time/date, situation, and day of week  Attention:  Good  Concentration:  Good  Memory:  WNL  Fund of knowledge:   Good  Insight:    Fair  Judgment:   Fair  Impulse Control:  Fair   Risk Assessment: Danger to Self:  No Self-injurious Behavior: No Danger to Others: No Duty to Warn:no Physical Aggression / Violence:No  Access to Firearms a concern: No  Gang Involvement:No   Subjective: Patient was engaged and cooperative throughout the session using time effectively to discuss thoughts, feelings, and coping skills. Patient was receptive to feedback and intervention from LCSW. Patient voices continued intermittent depressive symptoms and anxiety, increased mood regulation and overall increased engagement in meaningful activities.  Patient is likely to benefit from future treatment because they remain motivated to decrease symptoms and improve functioning.        Interventions: Cognitive Behavioral Therapy and client centered  Checked in with patient regarding their week. The clinician processed with the patient how they have been doing since the last follow-up session. LCSW conducted behavioral chain analysis identifying, thoughts, emotions, and behaviors, identifying points of intervention, highlighting unhelpful thoughts, challenging thoughts, thought reframing, and  naming/labeling emotions. LCSW reviewed noticing thoughts, labeling emotions and reframing thoughts. Provided support through active listening, validation of feelings, and highlighted patient's strengths.   Diagnosis:   ICD-10-CM   1. Generalized anxiety disorder  F41.1     2. Major depressive disorder, recurrent episode, mild ([place])  F33.0      Plan:  Patient's goal of treatment is to get better at regulating her emotions.    Treatment Target: Understand the relationship between thoughts, emotions, and behaviors  Psychoeducation on CBT model   Oriented the client to the therapeutic approach Teach the connection between thoughts, emotions, and behaviors    Treatment Target: Increase realistic balanced thinking -to learn how to replace thinking with thoughts that are more accurate or helpful Explore patient's thoughts, beliefs, automatic thoughts, assumptions  Identify and replace unhelpful thinking patterns (upsetting ideas, self-talk and mental images) Process distress and allow for emotional release  Questioning and challenging thoughts Cognitive reappraisal  Restructuring, Socratic questioning    Treatment Target: Reducing vulnerability to "emotional mind" and increase mood regulation  Radical Acceptance  Values clarification   Self-care - nutrition, sleep, exercise  Mindfulness Approaches   Future Appointments  Date Time Provider [place]  07/20/2022  2:00 PM Milton Ferguson, LCSW [facility] None    Milton Ferguson, LCSW

## 2022-07-11 DIAGNOSIS — R569 Unspecified convulsions: Secondary | ICD-10-CM | POA: Diagnosis not present

## 2022-07-20 ENCOUNTER — Ambulatory Visit: Payer: 59 | Admitting: Licensed Clinical Social Worker

## 2022-07-20 DIAGNOSIS — F410 Panic disorder [episodic paroxysmal anxiety] without agoraphobia: Secondary | ICD-10-CM

## 2022-07-20 DIAGNOSIS — F411 Generalized anxiety disorder: Secondary | ICD-10-CM

## 2022-07-20 DIAGNOSIS — F330 Major depressive disorder, recurrent, mild: Secondary | ICD-10-CM

## 2022-07-20 NOTE — Progress Notes (Signed)
Counselor/Therapist Progress Note  Patient ID: Taylor Oneill, MRN: [id number],    Date: 07/20/2022  Time Spent: 45 minutes    Treatment Type: Psychotherapy  Reported Symptoms:  mood "better" the last two weeks   Mental Status Exam:  Appearance:   Appropriate, Sharing, and Motivated     Behavior:  Appropriate, Sharing, and Motivated  Motor:  Normal  Speech/Language:   Clear and Coherent and Normal Rate  Affect:  Appropriate, Congruent, and Full Range  Mood:  normal  Thought process:  normal  Thought content:    WNL  Sensory/Perceptual disturbances:    WNL  Orientation:  oriented to person, place, time/date, situation, and day of week  Attention:  Good  Concentration:  Good  Memory:  WNL  Fund of knowledge:   Good  Insight:    Fair  Judgment:   Fair  Impulse Control:  Fair   Risk Assessment: Danger to Self:  No Self-injurious Behavior: No Danger to Others: No Duty to Warn:no Physical Aggression / Violence:No  Access to Firearms a concern: No  Gang Involvement:No   Subjective: Patient was receptive to feedback and intervention from LCSW and actively and effectively participated throughout the session. Patient reports overall mood is better over the last two weeks. Patient voices continued motivation for treatment and reports benefit of regular sessions in addressing symptoms.      Interventions: Cognitive Behavioral Therapy and Client centered  Checked in with patient regarding their week. Clinician met with patient to identify needs related to stressors and functioning, and assess and monitor for signs and symptoms of depression and anxiety, and assess safety. The clinician processed with the patient how they have been doing since the last follow-up session. LCSW highlighted patient's thoughts and feelings discussed helpful and unhelpful thoughts, reviewed self-care, including engaging in meaningful activities and with others, resting when needed, and exercising/going to  the gym.  Provided support through active listening, validation of feelings, and highlighted patient's strengths.    Diagnosis:   ICD-10-CM   1. Generalized anxiety disorder  F41.1     2. Major depressive disorder, recurrent episode, mild (HCC)  F33.0     3. Panic disorder  F41.0       Plan: Patient's goal of treatment is to get better at regulating her emotions.    Treatment Target: Understand the relationship between thoughts, emotions, and behaviors  Psychoeducation on CBT model   Oriented the client to the therapeutic approach Teach the connection between thoughts, emotions, and behaviors    Treatment Target: Increase realistic balanced thinking -to learn how to replace thinking with thoughts that are more accurate or helpful Explore patient's thoughts, beliefs, automatic thoughts, assumptions  Identify and replace unhelpful thinking patterns (upsetting ideas, self-talk and mental images) Process distress and allow for emotional release  Questioning and challenging thoughts Cognitive reappraisal  Restructuring, Socratic questioning    Treatment Target: Reducing vulnerability to "emotional mind" and increase mood regulation  Radical Acceptance  Values clarification   Self-care - nutrition, sleep, exercise  Mindfulness Approaches   Future Appointments  Date Time Provider [place]  08/03/2022 12:30 PM Milton Ferguson, LCSW [facility] None     Milton Ferguson, LCSW

## 2022-08-03 ENCOUNTER — Ambulatory Visit: Payer: 59 | Admitting: Licensed Clinical Social Worker

## 2022-08-03 DIAGNOSIS — F330 Major depressive disorder, recurrent, mild: Secondary | ICD-10-CM

## 2022-08-03 DIAGNOSIS — F411 Generalized anxiety disorder: Secondary | ICD-10-CM

## 2022-08-03 NOTE — Progress Notes (Signed)
Counselor/Therapist Progress Note  Patient ID: Taylor Oneill, MRN: [id number],    Date: 08/03/2022  Time Spent: 55 minutes    Treatment Type: Psychotherapy  Reported Symptoms:  "good", mood has been stable   Mental Status Exam:  Appearance:   Neat and Well Groomed     Behavior:  Appropriate, Sharing, and Motivated  Motor:  Normal  Speech/Language:   Clear and Coherent and Normal Rate  Affect:  Congruent and Full Range  Mood:  normal  Thought process:  normal  Thought content:    WNL  Sensory/Perceptual disturbances:    WNL  Orientation:  oriented to person, place, time/date, and situation  Attention:  Good  Concentration:  Good  Memory:  WNL  Fund of knowledge:   Good  Insight:    Fair  Judgment:   Fair  Impulse Control:  Fair   Risk Assessment: Danger to Self:  No Self-injurious Behavior: No Danger to Others: No Duty to Warn:no Physical Aggression / Violence:No  Access to Firearms a concern: No  Gang Involvement:No   Subjective: Patient was engaged and cooperative throughout the session using time effectively to discuss thoughts and feelings. Patient reports that her mood has been good. Patient voices continued motivation for treatment.      Interventions: Cognitive Behavioral Therapy and client centered  Checked in with patient regarding their week. LCSW processed with the patient how they have been doing since the last follow-up session. Explored patient's experience of avoidance due to anxiety/social anxiety symptoms. Discussed symptoms, taught patient hands in face metaphor for cognitive diffusion, challenged unhelpful thoughts and reframed thoughts, and engaged patient in plan ahead coping for upcoming shopping. Provided support through active listening, validation of feelings, and highlighted patient's strengths.  Diagnosis:   ICD-10-CM   1. Generalized anxiety disorder  F41.1     2. Major depressive disorder, recurrent episode, mild ([place])  F33.0       Plan: Patient's goal of treatment is to get better at regulating her emotions.    Treatment Target: Understand the relationship between thoughts, emotions, and behaviors  Psychoeducation on CBT model   Oriented the client to the therapeutic approach Teach the connection between thoughts, emotions, and behaviors    Treatment Target: Increase realistic balanced thinking -to learn how to replace thinking with thoughts that are more accurate or helpful Explore patient's thoughts, beliefs, automatic thoughts, assumptions  Identify and replace unhelpful thinking patterns (upsetting ideas, self-talk and mental images) Process distress and allow for emotional release  Questioning and challenging thoughts Cognitive reappraisal  Restructuring, Socratic questioning    Treatment Target: Reducing vulnerability to "emotional mind" and increase mood regulation  Radical Acceptance  Values clarification   Self-care - nutrition, sleep, exercise  Mindfulness Approaches   Treatment Target: Increase psychological flexibility (Acceptance, defusion, observer self/perspective-taking, present moment awareness, values and committed action) Teach mindfulness skills  Psychoeducation about cognitive fusion- Hand as thoughts metaphor  Increase awareness of control vs. acceptance Reduce avoidance of certain emotions or situations Discuss "unhooking" and emphasize that thoughts and feelings do not always lead to action Increasing self-knowledge  Getting in touch with the observant self Values-guided behavioral change  Future Appointments  Date Time Provider [place]  08/17/2022  1:00 PM Milton Ferguson, LCSW [facility] None    Milton Ferguson, LCSW

## 2022-08-17 ENCOUNTER — Ambulatory Visit: Payer: 59 | Admitting: Licensed Clinical Social Worker

## 2022-08-21 ENCOUNTER — Ambulatory Visit
Admission: EM | Admit: 2022-08-21 | Discharge: 2022-08-21 | Disposition: A | Discharge status: Home / Self Care | Payer: 59 | Attending: Family Medicine | Admitting: Family Medicine

## 2022-08-21 DIAGNOSIS — J329 Chronic sinusitis, unspecified: Secondary | ICD-10-CM

## 2022-08-21 DIAGNOSIS — J40 Bronchitis, not specified as acute or chronic: Secondary | ICD-10-CM | POA: Diagnosis not present

## 2022-08-21 MED ORDER — ALBUTEROL SULFATE HFA 108 (90 BASE) MCG/ACT IN AERS: 1.0000 | INHALATION_SPRAY | Freq: Four times a day (QID) | RESPIRATORY_TRACT | 0 refills | Status: DC | PRN

## 2022-08-21 MED ORDER — PROMETHAZINE-DM 6.25-15 MG/5ML PO SYRP: 5.0000 mL | ORAL_SOLUTION | Freq: Four times a day (QID) | ORAL | 0 refills | Status: DC | PRN

## 2022-08-21 MED ORDER — AMOXICILLIN-POT CLAVULANATE 500-125 MG PO TABS: 1.0000 | ORAL_TABLET | Freq: Three times a day (TID) | ORAL | 0 refills | Status: DC

## 2022-08-21 MED ORDER — PREDNISONE 10 MG (21) PO TBPK: ORAL_TABLET | Freq: Every day | ORAL | 0 refills | Status: DC

## 2022-08-21 NOTE — ED Triage Notes (Signed)
Patient with c/o cough and sinus pressure for about 5 days. Patient has tried tussin at home with no relief.

## 2022-08-21 NOTE — Discharge Instructions (Signed)
Follow up as needed
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
## 2022-08-24 ENCOUNTER — Ambulatory Visit: Payer: 59 | Admitting: Licensed Clinical Social Worker

## 2022-08-24 DIAGNOSIS — F411 Generalized anxiety disorder: Secondary | ICD-10-CM

## 2022-08-24 DIAGNOSIS — F330 Major depressive disorder, recurrent, mild: Secondary | ICD-10-CM

## 2022-08-24 NOTE — Progress Notes (Signed)
Counselor/Therapist Progress Note  Patient ID: Taylor Oneill, MRN: [id number],    Date: 08/24/2022  Time Spent: 50 minutes    Treatment Type: Psychotherapy  Reported Symptoms:  low mood, anxiety   Mental Status Exam:  Appearance:   Casual and Neat     Behavior:  Appropriate, Sharing, and Motivated  Motor:  Normal  Speech/Language:   Clear and Coherent and Normal Rate  Affect:  Appropriate, Congruent, and Full Range  Mood:  normal  Thought process:  normal  Thought content:    WNL  Sensory/Perceptual disturbances:    WNL  Orientation:  oriented to person, place, time/date, situation, and day of week  Attention:  Good  Concentration:  Good  Memory:  WNL  Fund of knowledge:   Good  Insight:    Fair  Judgment:   Fair  Impulse Control:  Fair   Risk Assessment: Danger to Self:  No Self-injurious Behavior: No Danger to Others: No Duty to Warn:no Physical Aggression / Violence:No  Access to Firearms a concern: No  Gang Involvement:No   Subjective: Patient was engaged and cooperative throughout the session using time effectively to discuss thoughts and feelings. Patient voices low mood and anxiety over the past two weeks. Patient is likely to benefit from future treatment because she remains motivated to decrease symptoms of anxiety and depressed mood and reports benefit of regular sessions in addressing these symptoms.    Interventions: Cognitive Behavioral Therapy and Client centered  Checked in with patient regarding their week. The clinician processed with the patient how they have been doing since the last follow-up session. LCSW explored patient's experiences related to feeling like a failure and not being where she thinks she should be in her life, highlighting unhelpful thoughts, challenging these thoughts and reframing thoughts leading to increased distress. LCSW reviewed noticing/awareness of thoughts and reframing thoughts, encouraged patient to increase use of guided  meditations and encouraged patient to continue to engage in meaningful activities such as going to the gym and socializing with positive peers. Provided support through active listening, validation of feelings, and highlighted patient's strengths.   Diagnosis:   ICD-10-CM   1. Generalized anxiety disorder  F41.1     2. Major depressive disorder, recurrent episode, mild  F33.0      Plan: Patient's goal of treatment is to get better at regulating her emotions.    Treatment Target: Understand the relationship between thoughts, emotions, and behaviors  Psychoeducation on CBT model   Oriented the client to the therapeutic approach Teach the connection between thoughts, emotions, and behaviors    Treatment Target: Increase realistic balanced thinking -to learn how to replace thinking with thoughts that are more accurate or helpful Explore patient's thoughts, beliefs, automatic thoughts, assumptions  Identify and replace unhelpful thinking patterns (upsetting ideas, self-talk and mental images) Process distress and allow for emotional release  Questioning and challenging thoughts Cognitive reappraisal  Restructuring, Socratic questioning    Treatment Target: Reducing vulnerability to "emotional mind" and increase mood regulation  Radical Acceptance  Values clarification   Self-care - nutrition, sleep, exercise  Mindfulness Approaches    Treatment Target: Increase psychological flexibility (Acceptance, defusion, observer self/perspective-taking, present moment awareness, values and committed action) Teach mindfulness skills  Psychoeducation about cognitive fusion- Hand as thoughts metaphor  Increase awareness of control vs. acceptance Reduce avoidance of certain emotions or situations Discuss "unhooking" and emphasize that thoughts and feelings do not always lead to action Increasing self-knowledge  Getting in touch with the observant  self Values-guided behavioral change  Future  Appointments  Date Time Provider Department Center  09/07/2022  1:00 PM Kathreen Cosier, LCSW [facility] None    Kathreen Cosier, [place]

## 2022-09-07 ENCOUNTER — Ambulatory Visit: Payer: 59 | Admitting: Licensed Clinical Social Worker

## 2022-09-07 DIAGNOSIS — F411 Generalized anxiety disorder: Secondary | ICD-10-CM

## 2022-09-07 DIAGNOSIS — F331 Major depressive disorder, recurrent, moderate: Secondary | ICD-10-CM

## 2022-09-07 DIAGNOSIS — F319 Bipolar disorder, unspecified: Secondary | ICD-10-CM

## 2022-09-07 NOTE — Progress Notes (Signed)
Counselor/Therapist Progress Note  Patient ID: Taylor Oneill, MRN: [id number],    Date: 09/07/2022  Time Spent: 56 minutes    Treatment Type: Psychotherapy  Reported Symptoms:  low mood, hopelessness, anxiety, anxious thoughts   Mental Status Exam:  Appearance:   Casual     Behavior:  Appropriate, Sharing, and Motivated  Motor:  Normal  Speech/Language:   Clear and Coherent and Normal Rate  Affect:  Appropriate, Congruent, and Full Range  Mood:  sad  Thought process:  normal  Thought content:    WNL  Sensory/Perceptual disturbances:    WNL  Orientation:  oriented to person, place, time/date, and situation  Attention:  Good  Concentration:  Good  Memory:  WNL  Fund of knowledge:   Good  Insight:    Fair  Judgment:   Fair  Impulse Control:  Fair   Risk Assessment: Danger to Self:  No Self-injurious Behavior: No Danger to Others: No Duty to Warn:no Physical Aggression / Violence:No  Access to Firearms a concern: No  Gang Involvement:No   Subjective: Patient was engaged and cooperative throughout the session using time effectively to discuss thoughts and feelings. She reports that she has been very frustrated, hopeless, sad, and anxious.She reports avoidance behaviors / having difficulties completing tasks. Patient was receptive to feedback and intervention from LCSW. She reports continued motivation and agrees to be evaluated for medication management to help with symptoms and agrees to call [facility] for appointment. Patient likely to benefit from a combination of medication management and talk therapy.     Interventions: Cognitive Behavioral Therapy and Client Centered  Checked in with patient regarding their week. LCSW processed with the patient how they have been doing since the last follow-up session. Engaged patient in processing thoughts and feelings related to current stressor- enrolling in school. Explored patient's perceptions and discussed  avoidance behaviors. LCSW reframed unhelpful thoughts, discussed deep breathing, reviewed moving toward action even when anxious, mindfulness meditation, and discussed medication evaluation for anxiety meds and possible ADD/ADHD symptoms. Provided support through active listening, validation of feelings, and highlighted patient's strengths.   Diagnosis:   ICD-10-CM   1. Generalized anxiety disorder  F41.1     2. Moderate episode of recurrent major depressive disorder  F33.1     3. Bipolar 1 disorder (HCC) dx'd 06/2018  F31.9       Plan: Patient's goal of treatment is to get better at regulating her emotions.    Treatment Target: Understand the relationship between thoughts, emotions, and behaviors  Psychoeducation on CBT model   Oriented the client to the therapeutic approach Teach the connection between thoughts, emotions, and behaviors    Treatment Target: Increase realistic balanced thinking -to learn how to replace thinking with thoughts that are more accurate or helpful Explore patient's thoughts, beliefs, automatic thoughts, assumptions  Identify and replace unhelpful thinking patterns (upsetting ideas, self-talk and mental images) Process distress and allow for emotional release  Questioning and challenging thoughts Cognitive reappraisal  Restructuring, Socratic questioning    Treatment Target: Reducing vulnerability to "emotional mind" and increase mood regulation  Radical Acceptance  Values clarification   Self-care - nutrition, sleep, exercise  Mindfulness Approaches    Treatment Target: Increase psychological flexibility (Acceptance, defusion, observer self/perspective-taking, present moment awareness, values and committed action) Teach mindfulness skills  Psychoeducation about cognitive fusion- Hand as thoughts metaphor  Increase awareness of control vs. acceptance Reduce avoidance of certain emotions or situations Discuss "unhooking" and emphasize that thoughts and  feelings do not always lead to action Increasing self-knowledge  Getting in touch with the observant self Values-guided behavioral change  Future Appointments  Date Time Provider Department Center  09/21/2022  2:00 PM Kathreen Cosier, LCSW [facility] None    Kathreen Cosier, [place]

## 2022-09-21 ENCOUNTER — Ambulatory Visit: Payer: 59 | Admitting: Licensed Clinical Social Worker

## 2022-09-21 DIAGNOSIS — F411 Generalized anxiety disorder: Secondary | ICD-10-CM

## 2022-09-21 NOTE — Progress Notes (Signed)
Counselor/Therapist Progress Note  Patient ID: Taylor Oneill, MRN: [id number],    Date: 09/21/2022  Time Spent: 50 minutes    Treatment Type: Psychotherapy  Reported Symptoms:  "not good or bad" down, low mood, mild anhedonia   Mental Status Exam:  Appearance:   Casual     Behavior:  Appropriate, Sharing, Rationalizing, and Resistant  Motor:  Normal  Speech/Language:   Clear and Coherent and Normal Rate  Affect:  Appropriate, Congruent, Full Range, and Tearful  Mood:  depressed  Thought process:  normal  Thought content:    WNL  Sensory/Perceptual disturbances:    WNL  Orientation:  oriented to person, place, time/date, and situation  Attention:  Good  Concentration:  Good  Memory:  WNL  Fund of knowledge:   Good  Insight:    Fair  Judgment:   Fair  Impulse Control:  Fair   Risk Assessment: Danger to Self:  No Self-injurious Behavior: No Danger to Others: No Duty to Warn:no Physical Aggression / Violence:No  Access to Firearms a concern: No  Gang Involvement:No   Subjective: Patient was engaged throughout the session using time to discuss thoughts and feelings. Patient reports that she did make progress towards her goals of re-enrolling in school and also voices inability to follow through with tasks necessary to achieving her goals, specifically difficulties making phone calls.  Patient voices continued motivation for treatment and by the end of the session she voiced she would make a call to get a medication management evaluation.     Interventions: Cognitive Behavioral Therapy and client Centered  Clinician met with patient to identify needs related to stressors and functioning, and assess and monitor for signs and symptoms of depression and anxiety, and assess safety. The clinician processed with the patient how they have been doing since the last follow-up session. LCSW explored patient's challenges with following through on making phone calls, identifying points  of intervention. LCSW empowered the patient through knowledge sharing and used behavioral rehearsal. Provided support through active listening, validation of feelings, and highlighted patient's strengths.   Diagnosis:   ICD-10-CM   1. Generalized anxiety disorder  F41.1      Plan:  Check in with client about follow through with scheduling medication management appt.  Patient's goal of treatment is to get better at regulating her emotions.    Treatment Target: Understand the relationship between thoughts, emotions, and behaviors  Psychoeducation on CBT model   Oriented the client to the therapeutic approach Teach the connection between thoughts, emotions, and behaviors    Treatment Target: Increase realistic balanced thinking -to learn how to replace thinking with thoughts that are more accurate or helpful Explore patient's thoughts, beliefs, automatic thoughts, assumptions  Identify and replace unhelpful thinking patterns (upsetting ideas, self-talk and mental images) Process distress and allow for emotional release  Questioning and challenging thoughts Cognitive reappraisal  Restructuring, Socratic questioning    Treatment Target: Reducing vulnerability to "emotional mind" and increase mood regulation  Radical Acceptance  Values clarification   Self-care - nutrition, sleep, exercise  Mindfulness Approaches    Treatment Target: Increase psychological flexibility (Acceptance, defusion, observer self/perspective-taking, present moment awareness, values and committed action) Teach mindfulness skills  Psychoeducation about cognitive fusion- Hand as thoughts metaphor  Increase awareness of control vs. acceptance Reduce avoidance of certain emotions or situations Discuss "unhooking" and emphasize that thoughts and feelings do not always lead to action Increasing self-knowledge  Getting in touch with the observant self Values-guided behavioral change  Future Appointments  Date Time  Provider Department Center  09/27/2022 10:00 AM Kathreen Cosier, LCSW [facility] None     Kathreen Cosier, [place]

## 2022-09-27 ENCOUNTER — Ambulatory Visit: Payer: 59 | Admitting: Licensed Clinical Social Worker

## 2022-09-27 DIAGNOSIS — F331 Major depressive disorder, recurrent, moderate: Secondary | ICD-10-CM

## 2022-09-27 DIAGNOSIS — F411 Generalized anxiety disorder: Secondary | ICD-10-CM

## 2022-09-27 NOTE — Progress Notes (Signed)
Counselor/Therapist Progress Note  Patient ID: HEAVIN FALKENHAGEN, MRN: [id number],    Date: 09/27/2022  Time Spent: 45 minutes    Treatment Type: Psychotherapy  Reported Symptoms:  Overall stable mood; anxiety, anxious thoughts and worries   Mental Status Exam:  Appearance:   Casual     Behavior:  Appropriate, Sharing, and Resistant  Motor:  Normal  Speech/Language:   Clear and Coherent and Normal Rate  Affect:  Appropriate, Congruent, and Full Range  Mood:  normal  Thought process:  normal  Thought content:    WNL  Sensory/Perceptual disturbances:    WNL  Orientation:  oriented to person, place, time/date, and situation  Attention:  Good  Concentration:  Good  Memory:  WNL  Fund of knowledge:   Good  Insight:    Fair  Judgment:   Fair  Impulse Control:  Fair   Risk Assessment: Danger to Self:  No Self-injurious Behavior: No Danger to Others: No Duty to Warn:no Physical Aggression / Violence:No  Access to Firearms a concern: No  Gang Involvement:No   Subjective: Patient was engaged throughout the session using time effectively to discuss thoughts, feelings, and coping strategies.  Patient voices some resistance to use of mindfulness meditation sharing that she is not able to focus, but agrees to practice it. She also reports she plans to schedule psychiatric appt. Today. Patient voices continued motivation for treatment.  Interventions: Cognitive Behavioral Therapy and Client Centered  Established psychological safety. Checked in with patient regarding their week. LCSW processed with the patient how they have been doing since the last follow-up session. LCSW discussed values driven behaviors; assisted patient in identifying thoughts, emotions and felt sensations within the body and encouraged her to aim to notice these sensations. Checked in on previous session regarding mindfulness and scheduling psychiatry appt. Led patient through guided meditation and processed with  patient. Provided support through active listening, validation of feelings, and highlighted patient's strengths.   Diagnosis:   ICD-10-CM   1. Generalized anxiety disorder  F41.1     2. Moderate episode of recurrent major depressive disorder (HCC)  F33.1       Plan: Check in with client about follow through with scheduling medication management appt.  Patient's goal of treatment is to get better at regulating her emotions.    Treatment Target: Understand the relationship between thoughts, emotions, and behaviors  Psychoeducation on CBT model   Oriented the client to the therapeutic approach Teach the connection between thoughts, emotions, and behaviors    Treatment Target: Increase realistic balanced thinking -to learn how to replace thinking with thoughts that are more accurate or helpful Explore patient's thoughts, beliefs, automatic thoughts, assumptions  Identify and replace unhelpful thinking patterns (upsetting ideas, self-talk and mental images) Process distress and allow for emotional release  Questioning and challenging thoughts Cognitive reappraisal  Restructuring, Socratic questioning    Treatment Target: Reducing vulnerability to "emotional mind" and increase mood regulation  Radical Acceptance  Values clarification   Self-care - nutrition, sleep, exercise  Mindfulness Approaches    Treatment Target: Increase psychological flexibility (Acceptance, defusion, observer self/perspective-taking, present moment awareness, values and committed action) Teach mindfulness skills  Psychoeducation about cognitive fusion- Hand as thoughts metaphor  Increase awareness of control vs. acceptance Reduce avoidance of certain emotions or situations Discuss "unhooking" and emphasize that thoughts and feelings do not always lead to action Increasing self-knowledge  Getting in touch with the observant self Values-guided behavioral change  Future Appointments  Date Time Provider  Department Center  10/11/2022  1:00 PM Kathreen Cosier, LCSW [facility] None    Kathreen Cosier, [place]

## 2022-10-11 ENCOUNTER — Ambulatory Visit: Payer: 59 | Admitting: Licensed Clinical Social Worker

## 2022-10-11 DIAGNOSIS — F411 Generalized anxiety disorder: Secondary | ICD-10-CM

## 2022-10-11 DIAGNOSIS — F331 Major depressive disorder, recurrent, moderate: Secondary | ICD-10-CM

## 2022-10-11 NOTE — Progress Notes (Signed)
Counselor/Therapist Progress Note  Patient ID: Taylor Oneill, MRN: [id number],    Date: 10/11/2022  Time Spent: 45 minutes    Treatment Type: Psychotherapy  Reported Symptoms:  Mood mostly better, difficulties getting things done/completed    Mental Status Exam:  Appearance:   Casual     Behavior:  Appropriate, Sharing, and Motivated  Motor:  Normal  Speech/Language:   Clear and Coherent and Normal Rate  Affect:  Congruent and Full Range  Mood:  normal  Thought process:  normal  Thought content:    WNL  Sensory/Perceptual disturbances:    WNL  Orientation:  oriented to person, place, time/date, situation, and day of week  Attention:  Good  Concentration:  Good  Memory:  WNL  Fund of knowledge:   Good  Insight:    Fair  Judgment:   Fair  Impulse Control:  Fair   Risk Assessment: Danger to Self:  No Self-injurious Behavior: No Danger to Others: No Duty to Warn:no Physical Aggression / Violence:No  Access to Firearms a concern: No  Gang Involvement:No   Subjective: Patient was engaged and cooperative throughout the session using time effectively to discuss thoughts and feelings. Patient voices that her mood has been better over the last two weeks due to increased social interactions and decrease in stress related to education situation; and reports difficulties completing tasks. However, she reports follow through with scheduling medication management appt. And with tasks related to re-enrolment in school.  Patient voices continued motivation for treatment and understanding of the importance of using coping skills to manage symptoms.   Interventions: Cognitive Behavioral Therapy Client Centered  Checked in with patient regarding their week. LCSW reviewed previous session regarding follow up with medication management appt. LCSW explored patient's progress towards behaviors that align with her values. Reviewed information on motivation through increasing gratitude and  mindfulness practice. Provided support through active listening, validation of feelings, and highlighted patient's strengths.    Diagnosis:   ICD-10-CM   1. Generalized anxiety disorder  F41.1     2. Moderate episode of recurrent major depressive disorder (HCC)  F33.1      Plan:  Patient's goal of treatment is to get better at regulating her emotions.    Treatment Target: Understand the relationship between thoughts, emotions, and behaviors  Psychoeducation on CBT model   Oriented the client to the therapeutic approach Teach the connection between thoughts, emotions, and behaviors    Treatment Target: Increase realistic balanced thinking -to learn how to replace thinking with thoughts that are more accurate or helpful Explore patient's thoughts, beliefs, automatic thoughts, assumptions  Identify and replace unhelpful thinking patterns (upsetting ideas, self-talk and mental images) Process distress and allow for emotional release  Questioning and challenging thoughts Cognitive reappraisal  Restructuring, Socratic questioning    Treatment Target: Reducing vulnerability to "emotional mind" and increase mood regulation  Radical Acceptance  Values clarification   Self-care - nutrition, sleep, exercise  Mindfulness Approaches    Treatment Target: Increase psychological flexibility (Acceptance, defusion, observer self/perspective-taking, present moment awareness, values and committed action) Teach mindfulness skills  Psychoeducation about cognitive fusion- Hand as thoughts metaphor  Increase awareness of control vs. acceptance Reduce avoidance of certain emotions or situations Discuss "unhooking" and emphasize that thoughts and feelings do not always lead to action Increasing self-knowledge  Getting in touch with the observant self Values-guided behavioral change  Future Appointments  Date Time Provider Department Center  10/26/2022 11:00 AM Kathreen Cosier, LCSW [facility] None  Kathreen Cosier, LCSW

## 2022-10-12 DIAGNOSIS — Z79899 Other long term (current) drug therapy: Secondary | ICD-10-CM | POA: Diagnosis not present

## 2022-10-12 DIAGNOSIS — G40909 Epilepsy, unspecified, not intractable, without status epilepticus: Secondary | ICD-10-CM | POA: Diagnosis not present

## 2022-10-12 DIAGNOSIS — F411 Generalized anxiety disorder: Secondary | ICD-10-CM | POA: Diagnosis not present

## 2022-10-12 DIAGNOSIS — F331 Major depressive disorder, recurrent, moderate: Secondary | ICD-10-CM | POA: Diagnosis not present

## 2022-10-26 ENCOUNTER — Encounter: Payer: Self-pay | Admitting: Licensed Clinical Social Worker

## 2022-10-26 ENCOUNTER — Ambulatory Visit: Payer: 59 | Admitting: Licensed Clinical Social Worker

## 2022-10-26 DIAGNOSIS — F411 Generalized anxiety disorder: Secondary | ICD-10-CM

## 2022-10-26 DIAGNOSIS — F3181 Bipolar II disorder: Secondary | ICD-10-CM

## 2022-10-26 NOTE — Progress Notes (Signed)
Counselor/Therapist Progress Note  Patient ID: KYIRA VOLKERT, MRN: [id number],    Date: 10/26/2022  Time Spent: 45 minutes   Treatment Type: Psychotherapy  Reported Symptoms:  overall stable mood, mild anxiety  Mental Status Exam:  Appearance:   Casual and Neat     Behavior:  Appropriate, Sharing, and Motivated  Motor:  Normal  Speech/Language:   Clear and Coherent and Normal Rate  Affect:  Appropriate, Congruent, and Full Range  Mood:  normal  Thought process:  normal  Thought content:    WNL  Sensory/Perceptual disturbances:    WNL  Orientation:  oriented to person, place, time/date, situation, and day of week  Attention:  Good  Concentration:  Good  Memory:  WNL  Fund of knowledge:   Good  Insight:    Fair  Judgment:   Fair  Impulse Control:  Fair   Risk Assessment: Danger to Self:  No Self-injurious Behavior: No Danger to Others: No Duty to Warn:no Physical Aggression / Violence:No  Access to Firearms a concern: No  Gang Involvement:No   Subjective: Patient was engaged and cooperative throughout the session using time effectively to discuss thoughts and feelings. Patient reports that she has been okay and has been following through tasks and was seen by psychiatry at [facility] and was diagnosed with Bipolar Disorder II (provisional) and was prescribed Cymbalta. Patient is already currently on lamotrigine for seizures.  Patient voices continued motivation for treatment.    Interventions: Cognitive Behavioral Therapy and client centered  Checked in with patient regarding their week. LCSW processed with the patient how they have been doing since the last follow-up session. Discussed psychiatry visit and follow through with other tasks that align with her values and goals. LCSW assisted patient in processing their thoughts and emotions regarding recent interactions with family of origin. LCSW validated patient's feelings and reframed thoughts leading to  increased distress. Provided support through active listening, validation of feelings, and highlighted patient's strengths.   Diagnosis:   ICD-10-CM   1. Generalized anxiety disorder  F41.1     2. Bipolar II disorder (HCC)  F31.81    Per patient report she was diagnosed with Bipolar II Disorder at [facility] 10/2022     Plan: Patient's goal of treatment is to get better at regulating her emotions.    Treatment Target: Understand the relationship between thoughts, emotions, and behaviors  Psychoeducation on CBT model   Oriented the client to the therapeutic approach Teach the connection between thoughts, emotions, and behaviors    Treatment Target: Increase realistic balanced thinking -to learn how to replace thinking with thoughts that are more accurate or helpful Explore patient's thoughts, beliefs, automatic thoughts, assumptions  Identify and replace unhelpful thinking patterns (upsetting ideas, self-talk and mental images) Process distress and allow for emotional release  Questioning and challenging thoughts Cognitive reappraisal  Restructuring, Socratic questioning    Treatment Target: Reducing vulnerability to "emotional mind" and increase mood regulation  Radical Acceptance  Values clarification   Self-care - nutrition, sleep, exercise  Mindfulness Approaches    Treatment Target: Increase psychological flexibility (Acceptance, defusion, observer self/perspective-taking, present moment awareness, values and committed action) Teach mindfulness skills  Psychoeducation about cognitive fusion- Hand as thoughts metaphor  Increase awareness of control vs. acceptance Reduce avoidance of certain emotions or situations Discuss "unhooking" and emphasize that thoughts and feelings do not always lead to action Increasing self-knowledge  Getting in touch with the observant self Values-guided behavioral change   Future  Appointments  Date Time Provider Department  Center  11/08/2022  1:00 PM Kathreen Cosier, LCSW [facility] None    Kathreen Cosier, [place]

## 2022-11-08 ENCOUNTER — Ambulatory Visit: Payer: 59 | Admitting: Licensed Clinical Social Worker

## 2022-11-08 DIAGNOSIS — F3181 Bipolar II disorder: Secondary | ICD-10-CM

## 2022-11-08 DIAGNOSIS — F411 Generalized anxiety disorder: Secondary | ICD-10-CM

## 2022-11-08 NOTE — Progress Notes (Signed)
Counselor/Therapist Progress Note  Patient ID: Taylor Oneill, MRN: [id number],    Date: 11/08/2022  Time Spent: 40 minutes    Treatment Type: Psychotherapy  Reported Symptoms:  Intermittent symptoms of depressed mood, frustration, ruminating thoughts, racing thoughts, restlessness, irritability, some impulsive spending,decrease in need for sleep, possible hypomanic symptoms   Mental Status Exam:  Appearance:   Casual and Neat     Behavior:  Appropriate, Sharing, and Motivated  Motor:  Normal  Speech/Language:   Clear and Coherent and Normal Rate  Affect:  Appropriate, Congruent, and Full Range  Mood:  normal  Thought process:  normal  Thought content:    WNL  Sensory/Perceptual disturbances:    WNL  Orientation:  oriented to person, place, time/date, situation, and day of week  Attention:  Good  Concentration:  Good  Memory:  WNL  Fund of knowledge:   Good  Insight:    Fair  Judgment:   Fair  Impulse Control:  Fair   Risk Assessment: Danger to Self:  No Self-injurious Behavior: No Danger to Others: No Duty to Warn:no Physical Aggression / Violence:No  Access to Firearms a concern: No  Gang Involvement:No   Subjective: Patient was receptive to feedback and intervention from LCSW and actively and effectively participated throughout the session. Patient reports she had a few difficult days and has also had days that she has enjoyed herself with friends. She reports she has used mindfulness meditations 4x since last sessions. Patient remains motivated to decrease symptoms and reports continued motivation for talk therapy and reports adherence to psychiatric med management - next appt scheduled for tomorrow. In addition, patient reports benefit from trazodone.    Interventions: Cognitive Behavioral Therapy and Client Centered  Checked in with patient regarding their week. Clinician met with patient to identify needs related to stressors and functioning, and assess and  monitor for mood and anxiety symptoms and assess safety. The clinician processed with the patient how they have been doing since the last follow-up session. LCSW reviewed mindfulness meditation to remain present focused, use of timers to manage time, listing out goals and reviewing them daily to assist patient in staying on task. Provided support through active listening, validation of feelings, and highlighted patient's strengths.    Diagnosis:   ICD-10-CM   1. Generalized anxiety disorder  F41.1     2. Bipolar II disorder (HCC)  F31.81      Plan:  Patient's goal of treatment is to get better at regulating her emotions.    Treatment Target: Understand the relationship between thoughts, emotions, and behaviors  Psychoeducation on CBT model   Oriented the client to the therapeutic approach Teach the connection between thoughts, emotions, and behaviors    Treatment Target: Increase realistic balanced thinking -to learn how to replace thinking with thoughts that are more accurate or helpful Explore patient's thoughts, beliefs, automatic thoughts, assumptions  Identify and replace unhelpful thinking patterns (upsetting ideas, self-talk and mental images) Process distress and allow for emotional release  Questioning and challenging thoughts Cognitive reappraisal  Restructuring, Socratic questioning    Treatment Target: Reducing vulnerability to "emotional mind" and increase mood regulation  Radical Acceptance  Values clarification   Self-care - nutrition, sleep, exercise  Mindfulness Approaches    Treatment Target: Increase psychological flexibility (Acceptance, defusion, observer self/perspective-taking, present moment awareness, values and committed action) Teach mindfulness skills  Psychoeducation about cognitive fusion- Hand as thoughts metaphor  Increase awareness of control vs. acceptance Reduce avoidance of certain emotions or situations  Discuss "unhooking" and emphasize that  thoughts and feelings do not always lead to action Increasing self-knowledge  Getting in touch with the observant self Values-guided behavioral change  Future Appointments  Date Time Provider Department Center  11/22/2022  2:10 PM Kathreen Cosier, LCSW [facility] None      Kathreen Cosier, LCSW

## 2022-11-09 DIAGNOSIS — F411 Generalized anxiety disorder: Secondary | ICD-10-CM | POA: Diagnosis not present

## 2022-11-09 DIAGNOSIS — G40209 Localization-related (focal) (partial) symptomatic epilepsy and epileptic syndromes with complex partial seizures, not intractable, without status epilepticus: Secondary | ICD-10-CM | POA: Diagnosis not present

## 2022-11-09 DIAGNOSIS — F331 Major depressive disorder, recurrent, moderate: Secondary | ICD-10-CM | POA: Diagnosis not present

## 2022-11-22 ENCOUNTER — Ambulatory Visit: Payer: 59 | Admitting: Licensed Clinical Social Worker

## 2022-11-22 DIAGNOSIS — F411 Generalized anxiety disorder: Secondary | ICD-10-CM

## 2022-11-22 DIAGNOSIS — F3181 Bipolar II disorder: Secondary | ICD-10-CM

## 2022-11-22 NOTE — Progress Notes (Signed)
Counselor/Therapist Progress Note  Patient ID: Taylor Oneill, MRN: [id number],    Date: 11/22/2022  Time Spent: 40 minutes    Treatment Type: Psychotherapy  Reported Symptoms:  Overall stable mood, a little more focused  Mental Status Exam:  Appearance:   Casual and Neat     Behavior:  Appropriate, Sharing, and Motivated  Motor:  Normal  Speech/Language:   Clear and Coherent and Normal Rate  Affect:  Appropriate, Congruent, and Full Range  Mood:  normal  Thought process:  normal  Thought content:    WNL  Sensory/Perceptual disturbances:    WNL  Orientation:  oriented to person, place, time/date, situation, and day of week  Attention:  Good  Concentration:  Good  Memory:  WNL  Fund of knowledge:   Good  Insight:    Fair  Judgment:   Fair  Impulse Control:  Fair   Risk Assessment: Danger to Self:  No Self-injurious Behavior: No Danger to Others: No Duty to Warn:no Physical Aggression / Violence:No  Access to Firearms a concern: No  Gang Involvement:No   Subjective: Patient was engaged and cooperative throughout the session using time effectively to discuss thoughts and feelings. Patient voices overall mood stability and reports continued motivation for treatment. Patient reports that she has been able to move through difficult emotions easier and feels like the medication management with Cymbalta is beneficial.   Interventions: Cognitive Behavioral Therapy and Client Centered  Checked in with patient regarding their week. LCSW assisted patient in processing their thoughts and emotions about what they have experienced in regard to previous intimate partner reaching out.  LCSW highlighted patient's thoughts and emotions, encouraged her to list out/write out why she does not want to engage in this relationship and identify what boundaries she wants to have in place. LCSW reviewed mindfulness strategies. Provided support through active listening, validation of feelings, and  highlighted patient's strengths.    Diagnosis:   ICD-10-CM   1. Generalized anxiety disorder  F41.1     2. Bipolar II disorder (HCC)  F31.81      Plan: Patient's goal of treatment is to get better at regulating her emotions.    Treatment Target: Understand the relationship between thoughts, emotions, and behaviors  Psychoeducation on CBT model   Oriented the client to the therapeutic approach Teach the connection between thoughts, emotions, and behaviors    Treatment Target: Increase realistic balanced thinking -to learn how to replace thinking with thoughts that are more accurate or helpful Explore patient's thoughts, beliefs, automatic thoughts, assumptions  Identify and replace unhelpful thinking patterns (upsetting ideas, self-talk and mental images) Process distress and allow for emotional release  Questioning and challenging thoughts Cognitive reappraisal  Restructuring, Socratic questioning    Treatment Target: Reducing vulnerability to "emotional mind" and increase mood regulation  Radical Acceptance  Values clarification   Self-care - nutrition, sleep, exercise  Mindfulness Approaches    Treatment Target: Increase psychological flexibility (Acceptance, defusion, observer self/perspective-taking, present moment awareness, values and committed action) Teach mindfulness skills  Psychoeducation about cognitive fusion- Hand as thoughts metaphor  Increase awareness of control vs. acceptance Reduce avoidance of certain emotions or situations Discuss "unhooking" and emphasize that thoughts and feelings do not always lead to action Increasing self-knowledge  Getting in touch with the observant self Values-guided behavioral change  Future Appointments  Date Time Provider Department Center  12/06/2022  2:00 PM Kathreen Cosier, LCSW [facility] None     Kathreen Cosier, LCSW

## 2022-12-06 ENCOUNTER — Ambulatory Visit: Payer: 59 | Admitting: Licensed Clinical Social Worker

## 2022-12-06 DIAGNOSIS — F411 Generalized anxiety disorder: Secondary | ICD-10-CM

## 2022-12-06 DIAGNOSIS — F3181 Bipolar II disorder: Secondary | ICD-10-CM

## 2022-12-06 NOTE — Progress Notes (Signed)
Counselor/Therapist Progress Note  Patient ID: Taylor Oneill, MRN: [id number],    Date: 12/06/2022  Time Spent: 40 minutes    Treatment Type: Psychotherapy  Reported Symptoms:  Overall good; waves of mild sadness and loneliness   Mental Status Exam:  Appearance:   Casual and Neat     Behavior:  Appropriate, Sharing, and Motivated  Motor:  Normal  Speech/Language:   Clear and Coherent and Normal Rate  Affect:  Appropriate, Congruent, and Full Range  Mood:  normal  Thought process:  normal  Thought content:    WNL  Sensory/Perceptual disturbances:    WNL  Orientation:  oriented to person, place, time/date, situation, and day of week  Attention:  Good  Concentration:  Good  Memory:  WNL  Fund of knowledge:   Good  Insight:    Fair  Judgment:   Fair  Impulse Control:  Fair   Risk Assessment: Danger to Self:  No Self-injurious Behavior: No Danger to Others: No Duty to Warn:no Physical Aggression / Violence:No  Access to Firearms a concern: No  Gang Involvement:No   Subjective: Patient was engaged and cooperative throughout the session using time effectively to discuss thoughts and feelings. Patient reports that her mood has been been good and she has been engaging in positive supportive relationships with family and friends. Patient voices continued motivation for treatment and continues to report benefit from a combination of talk-therapy and medication management.   Interventions: Cognitive Behavioral Therapy and Client Centered  Checked in with patient regarding their week. LCSW processed with the patient how they have been doing since the last follow-up session. LCSW and patient discussed progress towards goal of treatment over the past year, highlighting what contributes to patient's overall wellbeing, including supportive work environment, positive interactions with family, time for rest, engaging with friends and living within her values. Provided support through  active listening, validation of feelings, and highlighted patient's strengths.   Diagnosis:   ICD-10-CM   1. Bipolar II disorder (HCC)  F31.81     2. Generalized anxiety disorder  F41.1      Plan: Patient's goal of treatment is to get better at regulating her emotions.    Treatment Target: Understand the relationship between thoughts, emotions, and behaviors  Psychoeducation on CBT model   Oriented the client to the therapeutic approach Teach the connection between thoughts, emotions, and behaviors    Treatment Target: Increase realistic balanced thinking -to learn how to replace thinking with thoughts that are more accurate or helpful Explore patient's thoughts, beliefs, automatic thoughts, assumptions  Identify and replace unhelpful thinking patterns (upsetting ideas, self-talk and mental images) Process distress and allow for emotional release  Questioning and challenging thoughts Cognitive reappraisal  Restructuring, Socratic questioning    Treatment Target: Reducing vulnerability to "emotional mind" and increase mood regulation  Radical Acceptance  Values clarification   Self-care - nutrition, sleep, exercise  Mindfulness Approaches    Treatment Target: Increase psychological flexibility (Acceptance, defusion, observer self/perspective-taking, present moment awareness, values and committed action) Teach mindfulness skills  Psychoeducation about cognitive fusion- Hand as thoughts metaphor  Increase awareness of control vs. acceptance Reduce avoidance of certain emotions or situations Discuss "unhooking" and emphasize that thoughts and feelings do not always lead to action Increasing self-knowledge  Getting in touch with the observant self Values-guided behavioral change  Future Appointments  Date Time Provider Department Center  12/21/2022  2:00 PM Kathreen Cosier, LCSW [facility] None    Kathreen Cosier, LCSW

## 2022-12-07 DIAGNOSIS — G40209 Localization-related (focal) (partial) symptomatic epilepsy and epileptic syndromes with complex partial seizures, not intractable, without status epilepticus: Secondary | ICD-10-CM | POA: Diagnosis not present

## 2022-12-07 DIAGNOSIS — F331 Major depressive disorder, recurrent, moderate: Secondary | ICD-10-CM | POA: Diagnosis not present

## 2022-12-21 ENCOUNTER — Ambulatory Visit: Payer: 59 | Admitting: Licensed Clinical Social Worker

## 2022-12-22 DIAGNOSIS — F411 Generalized anxiety disorder: Secondary | ICD-10-CM | POA: Diagnosis not present

## 2022-12-22 DIAGNOSIS — G40209 Localization-related (focal) (partial) symptomatic epilepsy and epileptic syndromes with complex partial seizures, not intractable, without status epilepticus: Secondary | ICD-10-CM | POA: Diagnosis not present

## 2022-12-22 DIAGNOSIS — F3181 Bipolar II disorder: Secondary | ICD-10-CM | POA: Diagnosis not present

## 2022-12-22 DIAGNOSIS — Z79899 Other long term (current) drug therapy: Secondary | ICD-10-CM | POA: Diagnosis not present

## 2023-01-04 ENCOUNTER — Ambulatory Visit: Payer: 59 | Admitting: Licensed Clinical Social Worker

## 2023-01-04 DIAGNOSIS — F411 Generalized anxiety disorder: Secondary | ICD-10-CM

## 2023-01-04 DIAGNOSIS — F3181 Bipolar II disorder: Secondary | ICD-10-CM

## 2023-01-04 NOTE — Progress Notes (Signed)
Counselor/Therapist Progress Note  Patient ID: Taylor Oneill, MRN: [id number],    Date: 01/04/2023  Time Spent: 50 minutes    Treatment Type: Psychotherapy  Reported Symptoms:  Overall stable mood; two brief episodes of feeling intense emotions   Mental Status Exam:  Appearance:   Casual, Neat, and Well Groomed     Behavior:  Appropriate, Sharing, and Motivated  Motor:  Normal  Speech/Language:   Clear and Coherent and Normal Rate  Affect:  Appropriate, Congruent, and Full Range  Mood:  normal  Thought process:  normal  Thought content:    WNL  Sensory/Perceptual disturbances:    WNL  Orientation:  oriented to person, place, time/date, situation, and day of week  Attention:  Good  Concentration:  Good  Memory:  WNL  Fund of knowledge:   Good  Insight:    Fair  Judgment:   Fair  Impulse Control:  Fair   Risk Assessment: Danger to Self:  No Self-injurious Behavior: No Danger to Others: No Duty to Warn:no Physical Aggression / Violence:No  Access to Firearms a concern: No  Gang Involvement:No   Subjective: Patient was engaged and cooperative throughout the session using time effectively to discuss thoughts and feelings. She reports she has been well overall with the exception of two "meltdowns" that she reports she moved through quicker then in the past. Patient voices continued motivation for treatment and benefit from the combination of talk-therapy and medication management.   Interventions: Cognitive Behavioral Therapy and Client Centered  LCSW met with patient to identify needs related to stressors and functioning, and assess and monitor mood and anxiety symptoms and assess safety. Checked in with patient regarding how that have been since previous session. LCSW assisted patient in processing their thoughts and emotions regarding progress she has made towards her life goals. LCSW reviewed cognitive fusion and self-care with patient.  Provided support through active  listening, validation of feelings, and highlighted patient's strengths.    Diagnosis:   ICD-10-CM   1. Bipolar II disorder (HCC)  F31.81     2. Generalized anxiety disorder  F41.1      Plan:  Patient's goal of treatment is to get better at regulating her emotions.    Treatment Target: Understand the relationship between thoughts, emotions, and behaviors  Psychoeducation on CBT model   Oriented the client to the therapeutic approach Teach the connection between thoughts, emotions, and behaviors    Treatment Target: Increase realistic balanced thinking -to learn how to replace thinking with thoughts that are more accurate or helpful Explore patient's thoughts, beliefs, automatic thoughts, assumptions  Identify and replace unhelpful thinking patterns (upsetting ideas, self-talk and mental images) Process distress and allow for emotional release  Questioning and challenging thoughts Cognitive reappraisal  Restructuring, Socratic questioning    Treatment Target: Reducing vulnerability to "emotional mind" and increase mood regulation  Radical Acceptance  Values clarification   Self-care - nutrition, sleep, exercise  Mindfulness Approaches    Treatment Target: Increase psychological flexibility (Acceptance, defusion, observer self/perspective-taking, present moment awareness, values and committed action) Teach mindfulness skills  Psychoeducation about cognitive fusion- Hand as thoughts metaphor  Increase awareness of control vs. acceptance Reduce avoidance of certain emotions or situations Discuss "unhooking" and emphasize that thoughts and feelings do not always lead to action Increasing self-knowledge  Getting in touch with the observant self Values-guided behavioral change  Future Appointments  Date Time Provider Department Center  01/18/2023 11:30 AM Kathreen Cosier, LCSW [facility] None  Kathreen Cosier, LCSW

## 2023-01-08 DIAGNOSIS — F3181 Bipolar II disorder: Secondary | ICD-10-CM | POA: Diagnosis not present

## 2023-01-08 DIAGNOSIS — Z79899 Other long term (current) drug therapy: Secondary | ICD-10-CM | POA: Diagnosis not present

## 2023-01-08 DIAGNOSIS — F411 Generalized anxiety disorder: Secondary | ICD-10-CM | POA: Diagnosis not present

## 2023-01-18 ENCOUNTER — Ambulatory Visit: Payer: 59 | Admitting: Licensed Clinical Social Worker

## 2023-01-18 DIAGNOSIS — F3181 Bipolar II disorder: Secondary | ICD-10-CM

## 2023-01-18 DIAGNOSIS — F411 Generalized anxiety disorder: Secondary | ICD-10-CM

## 2023-01-18 NOTE — Progress Notes (Signed)
Counselor/Therapist Progress Note  Patient ID: Taylor Oneill, MRN: [id number],    Date: 01/18/2023  Time Spent: 35 minutes    Treatment Type: Psychotherapy  Reported Symptoms:  Overall stable mood "okay, neutral"   Mental Status Exam:  Appearance:   Casual     Behavior:  Appropriate, Sharing, and Motivated  Motor:  Normal  Speech/Language:   Clear and Coherent and Normal Rate  Affect:  Appropriate, Congruent, and Full Range  Mood:  normal  Thought process:  normal  Thought content:    WNL  Sensory/Perceptual disturbances:    WNL  Orientation:  oriented to person, place, time/date, situation, and day of week  Attention:  Good  Concentration:  Good  Memory:  WNL  Fund of knowledge:   Good  Insight:    Fair  Judgment:   Fair  Impulse Control:  Fair   Risk Assessment: Danger to Self:  No Self-injurious Behavior: No Danger to Others: No Duty to Warn:no Physical Aggression / Violence:No  Access to Firearms a concern: No  Gang Involvement:No   Subjective: Patient was engaged and cooperative throughout the session using time effectively to discuss thoughts and feelings. She reports her mood has been overall "okay" neutral" she reports school is going well, following up on other goal related activities and some stress with new co-workers. Patient was receptive to feedback and intervention from LCSW and voices continued motivation for treatment.  Interventions: Cognitive Behavioral Therapy and Client Centered  Checked in with patient regarding how they have been since last session. LCSW assisted patient in processing their thoughts and emotions related to work stress, validating her feelings of discomfort,  reframing unhelpful thoughts and reviewing the importance of positive social interactions and self-care. Provided support through active listening, validation of feelings, and highlighted patient's strengths.    Diagnosis:   ICD-10-CM   1. Bipolar II disorder (HCC)  F31.81      2. Generalized anxiety disorder  F41.1      Plan:  Patient's goal of treatment is to get better at regulating her emotions.    Treatment Target: Understand the relationship between thoughts, emotions, and behaviors  Psychoeducation on CBT model   Oriented the client to the therapeutic approach Teach the connection between thoughts, emotions, and behaviors    Treatment Target: Increase realistic balanced thinking -to learn how to replace thinking with thoughts that are more accurate or helpful Explore patient's thoughts, beliefs, automatic thoughts, assumptions  Identify and replace unhelpful thinking patterns (upsetting ideas, self-talk and mental images) Process distress and allow for emotional release  Questioning and challenging thoughts Cognitive reappraisal  Restructuring, Socratic questioning    Treatment Target: Reducing vulnerability to "emotional mind" and increase mood regulation  Radical Acceptance  Values clarification   Self-care - nutrition, sleep, exercise  Mindfulness Approaches    Treatment Target: Increase psychological flexibility (Acceptance, defusion, observer self/perspective-taking, present moment awareness, values and committed action) Teach mindfulness skills  Psychoeducation about cognitive fusion- Hand as thoughts metaphor  Increase awareness of control vs. acceptance Reduce avoidance of certain emotions or situations Discuss "unhooking" and emphasize that thoughts and feelings do not always lead to action Increasing self-knowledge  Getting in touch with the observant self Values-guided behavioral change  Future Appointments  Date Time Provider Department Center  01/30/2023 10:50 AM [facility] PROVIDER [facility] None  02/08/2023 11:10 AM Kathreen Cosier, LCSW [facility] None    Kathreen Cosier, LCSW

## 2023-01-22 DIAGNOSIS — F411 Generalized anxiety disorder: Secondary | ICD-10-CM | POA: Diagnosis not present

## 2023-01-22 DIAGNOSIS — F3181 Bipolar II disorder: Secondary | ICD-10-CM | POA: Diagnosis not present

## 2023-01-30 ENCOUNTER — Ambulatory Visit: Payer: 59 | Admitting: Advanced Practice Midwife

## 2023-01-30 DIAGNOSIS — F4310 Post-traumatic stress disorder, unspecified: Secondary | ICD-10-CM

## 2023-01-30 DIAGNOSIS — F319 Bipolar disorder, unspecified: Secondary | ICD-10-CM | POA: Insufficient documentation

## 2023-01-30 DIAGNOSIS — Z720 Tobacco use: Secondary | ICD-10-CM

## 2023-01-30 DIAGNOSIS — Z113 Encounter for screening for infections with a predominantly sexual mode of transmission: Secondary | ICD-10-CM

## 2023-01-30 LAB — HM HEPATITIS C SCREENING LAB: HM Hepatitis Screen: NEGATIVE

## 2023-01-30 LAB — HM HIV SCREENING LAB: HM HIV Screening: NEGATIVE

## 2023-01-30 LAB — WET PREP FOR TRICH, YEAST, CLUE
Trichomonas Exam: NEGATIVE
Yeast Exam: NEGATIVE
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
## 2023-01-30 NOTE — Progress Notes (Signed)
Patient here for STD testing.Burt Knack, RN

## 2023-01-30 NOTE — Progress Notes (Signed)
Wet mount results reviewed. Per SO no treatment indicated. Tawny Hopping, RN

## 2023-02-04 LAB — GONOCOCCUS CULTURE

## 2023-02-07 DIAGNOSIS — F3181 Bipolar II disorder: Secondary | ICD-10-CM | POA: Diagnosis not present

## 2023-02-07 DIAGNOSIS — F411 Generalized anxiety disorder: Secondary | ICD-10-CM | POA: Diagnosis not present

## 2023-02-08 ENCOUNTER — Ambulatory Visit: Payer: 59 | Admitting: Licensed Clinical Social Worker

## 2023-02-15 DIAGNOSIS — F411 Generalized anxiety disorder: Secondary | ICD-10-CM | POA: Diagnosis not present

## 2023-02-15 DIAGNOSIS — G40209 Localization-related (focal) (partial) symptomatic epilepsy and epileptic syndromes with complex partial seizures, not intractable, without status epilepticus: Secondary | ICD-10-CM | POA: Diagnosis not present

## 2023-02-15 DIAGNOSIS — F3181 Bipolar II disorder: Secondary | ICD-10-CM | POA: Diagnosis not present

## 2023-02-28 ENCOUNTER — Ambulatory Visit: Payer: 59 | Admitting: Licensed Clinical Social Worker

## 2023-02-28 DIAGNOSIS — F411 Generalized anxiety disorder: Secondary | ICD-10-CM

## 2023-02-28 DIAGNOSIS — F3181 Bipolar II disorder: Secondary | ICD-10-CM

## 2023-02-28 NOTE — Progress Notes (Signed)
Counselor/Therapist Progress Note  Patient ID: Taylor Oneill, MRN: [id number],    Date: 02/28/2023  Time Spent: 48 minutes    Treatment Type: Psychotherapy  Reported Symptoms:  tired, mood apathetic, mild anxiety, distractibility   Mental Status Exam:  Appearance:   Casual     Behavior:  Appropriate, Sharing, and Motivated  Motor:  Normal  Speech/Language:   Clear and Coherent and Normal Rate  Affect:  Appropriate, Congruent, and Full Range  Mood:  normal  Thought process:  normal  Thought content:    WNL  Sensory/Perceptual disturbances:    WNL  Orientation:  oriented to person, place, time/date, situation, and day of week  Attention:  Good  Concentration:  Good  Memory:  WNL  Fund of knowledge:   Good  Insight:    Fair  Judgment:   Fair  Impulse Control:  Fair   Risk Assessment: Danger to Self:  No Self-injurious Behavior: No Danger to Others: No Duty to Warn:no Physical Aggression / Violence:No  Access to Firearms a concern: No  Gang Involvement:No   Subjective: Patient was engaged and cooperative throughout the session using time effectively to discuss thoughts and feelings. She reports that she has had multiple med changes due to feeling apathetic, and she also describes mild anxiety. Patient was receptive to feedback and intervention from LCSW. Patient voices continued motivation for treatment and understanding of  . Patient is likely to benefit from future treatment because they remain motivated to decrease  and   and reports benefit of regular sessions.        Interventions: Cognitive Behavioral Therapy and Client Centered  Checked in with patient and processed how they have been doing since last follow up session, conducted brief assessment of current symptoms and psychosocial stressors. LCSW assisted patient in processing their emotions about what they have experienced with the events of [place] and with work and school. LCSW validated patient's feelings,  provided space for patient to ventilate, explored thoughts and emotions, reframed thoughts, highlighted awareness and interpersonal skills and the benefit patient derives from relationships.   Diagnosis:   ICD-10-CM   1. Bipolar II disorder (HCC)  F31.81     2. Generalized anxiety disorder  F41.1      Plan:  Patient's goal of treatment is to get better at regulating her emotions.    Treatment Target: Understand the relationship between thoughts, emotions, and behaviors  Psychoeducation on CBT model   Oriented the client to the therapeutic approach Teach the connection between thoughts, emotions, and behaviors    Treatment Target: Increase realistic balanced thinking -to learn how to replace thinking with thoughts that are more accurate or helpful Explore patient's thoughts, beliefs, automatic thoughts, assumptions  Identify and replace unhelpful thinking patterns (upsetting ideas, self-talk and mental images) Process distress and allow for emotional release  Questioning and challenging thoughts Cognitive reappraisal  Restructuring, Socratic questioning    Treatment Target: Reducing vulnerability to "emotional mind" and increase mood regulation  Radical Acceptance  Values clarification   Self-care - nutrition, sleep, exercise  Mindfulness Approaches    Treatment Target: Increase psychological flexibility (Acceptance, defusion, observer self/perspective-taking, present moment awareness, values and committed action) Teach mindfulness skills  Psychoeducation about cognitive fusion- Hand as thoughts metaphor  Increase awareness of control vs. acceptance Reduce avoidance of certain emotions or situations Discuss "unhooking" and emphasize that thoughts and feelings do not always lead to action Increasing self-knowledge  Getting in touch with the observant self Values-guided behavioral change  Future Appointments  Date Time Provider Department Center  03/21/2023  1:00 PM Kathreen Cosier, LCSW [facility] None      Kathreen Cosier, [place]

## 2023-03-15 DIAGNOSIS — F902 Attention-deficit hyperactivity disorder, combined type: Secondary | ICD-10-CM | POA: Diagnosis not present

## 2023-03-15 DIAGNOSIS — F3181 Bipolar II disorder: Secondary | ICD-10-CM | POA: Diagnosis not present

## 2023-03-15 DIAGNOSIS — F411 Generalized anxiety disorder: Secondary | ICD-10-CM | POA: Diagnosis not present

## 2023-03-15 DIAGNOSIS — G40209 Localization-related (focal) (partial) symptomatic epilepsy and epileptic syndromes with complex partial seizures, not intractable, without status epilepticus: Secondary | ICD-10-CM | POA: Diagnosis not present

## 2023-03-21 ENCOUNTER — Ambulatory Visit: Payer: 59 | Admitting: Licensed Clinical Social Worker

## 2023-03-21 DIAGNOSIS — F3181 Bipolar II disorder: Secondary | ICD-10-CM

## 2023-03-21 DIAGNOSIS — F411 Generalized anxiety disorder: Secondary | ICD-10-CM

## 2023-03-21 NOTE — Progress Notes (Signed)
Counselor/Therapist Progress Note  Patient ID: Taylor Oneill, MRN: [id number],    Date: 03/21/2023  Time Spent: 48 minutes    Treatment Type: Psychotherapy  Reported Symptoms:  mostly stable mood; increased irritability   Mental Status Exam:  Appearance:   Casual     Behavior:  Appropriate, Sharing, and Motivated  Motor:  Normal  Speech/Language:   Clear and Coherent and Normal Rate  Affect:  Appropriate, Congruent, Full Range, and Tearful  Mood:  normal  Thought process:  normal  Thought content:    WNL  Sensory/Perceptual disturbances:    WNL  Orientation:  oriented to person, place, time/date, situation, and day of week  Attention:  Good  Concentration:  Good  Memory:  WNL  Fund of knowledge:   Good  Insight:    Fair  Judgment:   Fair  Impulse Control:  Fair   Risk Assessment: Danger to Self:  No Self-injurious Behavior: No Danger to Others: No Duty to Warn:no Physical Aggression / Violence:No  Access to Firearms a concern: No  Gang Involvement:No   Subjective: Patient was engaged and cooperative throughout the session using time effectively to discuss thoughts and feelings. Patient reports updates in medication and new dx of ADHD. Current meds. Risperidone, Cymbalta, Lamictal, feels benefit. Patient was receptive to feedback and intervention from LCSW.   Interventions: Cognitive Behavioral Therapy and client centered  Established psychological safety. LCSW met with patient to identify needs related to stressors and functioning, and assess and monitor for mood and anxiety symptoms, and assess safety. Checked in with patient regarding symptoms and psychosocial stressors, processed with the patient how they have been doing since the last follow-up session. LCSW explored patient's troubles sticking up for herself, conducting a chain analysis, identifying points of intervention, identifying core beliefs, unhelpful thoughts, creating flexibility in thinking, and  identifying the exceptions. LCSW and patient discussed challenges in intimate partner relationships and plan to discuss more at next session. Provided support through active listening, validation of feelings, and highlighted patient's strengths.   Diagnosis:   ICD-10-CM   1. Bipolar II disorder (HCC)  F31.81     2. Generalized anxiety disorder  F41.1      Plan: Focus on healthy relationships   Patient's goal of treatment is to get better at regulating her emotions.    Treatment Target: Understand the relationship between thoughts, emotions, and behaviors  Psychoeducation on CBT model   Oriented the client to the therapeutic approach Teach the connection between thoughts, emotions, and behaviors    Treatment Target: Increase realistic balanced thinking -to learn how to replace thinking with thoughts that are more accurate or helpful Explore patient's thoughts, beliefs, automatic thoughts, assumptions  Identify and replace unhelpful thinking patterns (upsetting ideas, self-talk and mental images) Process distress and allow for emotional release  Questioning and challenging thoughts Cognitive reappraisal  Restructuring, Socratic questioning    Treatment Target: Reducing vulnerability to "emotional mind" and increase mood regulation  Radical Acceptance  Values clarification   Self-care - nutrition, sleep, exercise  Mindfulness Approaches    Treatment Target: Increase psychological flexibility (Acceptance, defusion, observer self/perspective-taking, present moment awareness, values and committed action) Teach mindfulness skills  Psychoeducation about cognitive fusion- Hand as thoughts metaphor  Increase awareness of control vs. acceptance Reduce avoidance of certain emotions or situations Discuss "unhooking" and emphasize that thoughts and feelings do not always lead to action Increasing self-knowledge  Getting in touch with the observant self Values-guided behavioral  change  Future Appointments  Date  Time Provider Department Center  04/19/2023  1:00 PM Kathreen Cosier, LCSW [facility] None     Kathreen Cosier, [place]

## 2023-03-27 DIAGNOSIS — F411 Generalized anxiety disorder: Secondary | ICD-10-CM | POA: Diagnosis not present

## 2023-03-27 DIAGNOSIS — Z79899 Other long term (current) drug therapy: Secondary | ICD-10-CM | POA: Diagnosis not present

## 2023-03-27 DIAGNOSIS — G40209 Localization-related (focal) (partial) symptomatic epilepsy and epileptic syndromes with complex partial seizures, not intractable, without status epilepticus: Secondary | ICD-10-CM | POA: Diagnosis not present

## 2023-03-27 DIAGNOSIS — F902 Attention-deficit hyperactivity disorder, combined type: Secondary | ICD-10-CM | POA: Diagnosis not present

## 2023-03-27 DIAGNOSIS — F3181 Bipolar II disorder: Secondary | ICD-10-CM | POA: Diagnosis not present

## 2023-03-28 DIAGNOSIS — F32A Depression, unspecified: Secondary | ICD-10-CM | POA: Diagnosis not present

## 2023-03-28 DIAGNOSIS — R569 Unspecified convulsions: Secondary | ICD-10-CM | POA: Diagnosis not present

## 2023-03-28 DIAGNOSIS — F419 Anxiety disorder, unspecified: Secondary | ICD-10-CM | POA: Diagnosis not present

## 2023-03-28 DIAGNOSIS — G479 Sleep disorder, unspecified: Secondary | ICD-10-CM | POA: Diagnosis not present

## 2023-04-05 DIAGNOSIS — S9031XA Contusion of right foot, initial encounter: Secondary | ICD-10-CM | POA: Diagnosis not present

## 2023-04-10 DIAGNOSIS — F3181 Bipolar II disorder: Secondary | ICD-10-CM | POA: Diagnosis not present

## 2023-04-10 DIAGNOSIS — G40209 Localization-related (focal) (partial) symptomatic epilepsy and epileptic syndromes with complex partial seizures, not intractable, without status epilepticus: Secondary | ICD-10-CM | POA: Diagnosis not present

## 2023-04-10 DIAGNOSIS — F411 Generalized anxiety disorder: Secondary | ICD-10-CM | POA: Diagnosis not present

## 2023-04-10 DIAGNOSIS — F902 Attention-deficit hyperactivity disorder, combined type: Secondary | ICD-10-CM | POA: Diagnosis not present

## 2023-04-11 DIAGNOSIS — Z79899 Other long term (current) drug therapy: Secondary | ICD-10-CM | POA: Diagnosis not present

## 2023-04-19 ENCOUNTER — Ambulatory Visit: Payer: 59 | Admitting: Licensed Clinical Social Worker

## 2023-04-19 DIAGNOSIS — F411 Generalized anxiety disorder: Secondary | ICD-10-CM

## 2023-04-19 DIAGNOSIS — F3181 Bipolar II disorder: Secondary | ICD-10-CM

## 2023-04-19 NOTE — Progress Notes (Signed)
Counselor/Therapist Progress Note  Patient ID: Taylor Oneill, MRN: [id number],    Date: 04/19/2023  Time Spent: 45 minutes    Treatment Type: Psychotherapy  Reported Symptoms:  Stable mood; anxiety, worries   Mental Status Exam:  Appearance:   Casual and Well Groomed     Behavior:  Appropriate, Sharing, and Motivated  Motor:  Normal  Speech/Language:   Clear and Coherent and Normal Rate  Affect:  Appropriate, Congruent, and Full Range  Mood:  normal  Thought process:  normal  Thought content:    WNL  Sensory/Perceptual disturbances:    WNL  Orientation:  oriented to person, place, time/date, situation, and day of week  Attention:  Good  Concentration:  Good  Memory:  WNL  Fund of knowledge:   Good  Insight:    Good  Judgment:   Good  Impulse Control:  Good   Risk Assessment: Danger to Self:  No Self-injurious Behavior: No Danger to Others: No Duty to Warn:no Physical Aggression / Violence:No  Access to Firearms a concern: No  Gang Involvement:No   Subjective: Patient reports that her mood has been stable, but has struggled with some anxiety when thinking about future plans leading to difficulties in setting future goals. Patient currently prescribed Lamotrigine, Adderall, duloxetine, and risperidone for Bipolar Disorder, Anxiety and newly diagnosed ADHD. Patient was receptive to feedback and intervention from LCSW and actively and effectively participated throughout the session.     Interventions: Cognitive Behavioral Therapy and Client Centered  LCSW established psychological safety.  LCSW checked in with patient regarding how they have been doing since the last follow-up session. LCSW explored patient's challenges with making future plans identifying overwhelm and anxiety, discussed What If's and led patient through The Heart Hug Skill WITH Resourcing a Pleasant, Meaningful, or Neutral Memory to use to reset to calm. LCSW reviewed the importance of nutrition /  self-care with patient.   Diagnosis:   ICD-10-CM   1. Bipolar II disorder (HCC)  F31.81     2. Generalized anxiety disorder  F41.1      Plan: Patient's goal of treatment is to get better at regulating her emotions.    Treatment Target: Understand the relationship between thoughts, emotions, and behaviors  Psychoeducation on CBT model   Oriented the client to the therapeutic approach Teach the connection between thoughts, emotions, and behaviors    Treatment Target: Increase realistic balanced thinking -to learn how to replace thinking with thoughts that are more accurate or helpful Explore patient's thoughts, beliefs, automatic thoughts, assumptions  Identify and replace unhelpful thinking patterns (upsetting ideas, self-talk and mental images) Process distress and allow for emotional release  Questioning and challenging thoughts Cognitive reappraisal  Restructuring, Socratic questioning    Treatment Target: Reducing vulnerability to "emotional mind" and increase mood regulation  Radical Acceptance  Values clarification   Self-care - nutrition, sleep, exercise  Mindfulness Approaches    Treatment Target: Increase psychological flexibility (Acceptance, defusion, observer self/perspective-taking, present moment awareness, values and committed action) Teach mindfulness skills  Psychoeducation about cognitive fusion- Hand as thoughts metaphor  Increase awareness of control vs. acceptance Reduce avoidance of certain emotions or situations Discuss "unhooking" and emphasize that thoughts and feelings do not always lead to action Increasing self-knowledge  Getting in touch with the observant self Values-guided behavioral change  Future Appointments  Date Time Provider Department Center  05/17/2023 10:30 AM Kathreen Cosier, LCSW [facility] None    Kathreen Cosier, LCSW

## 2023-04-24 DIAGNOSIS — G40909 Epilepsy, unspecified, not intractable, without status epilepticus: Secondary | ICD-10-CM | POA: Diagnosis not present

## 2023-04-24 DIAGNOSIS — Z791 Long term (current) use of non-steroidal anti-inflammatories (NSAID): Secondary | ICD-10-CM | POA: Diagnosis not present

## 2023-04-24 DIAGNOSIS — F411 Generalized anxiety disorder: Secondary | ICD-10-CM | POA: Diagnosis not present

## 2023-04-24 DIAGNOSIS — F909 Attention-deficit hyperactivity disorder, unspecified type: Secondary | ICD-10-CM | POA: Diagnosis not present

## 2023-04-24 DIAGNOSIS — F319 Bipolar disorder, unspecified: Secondary | ICD-10-CM | POA: Diagnosis not present

## 2023-04-26 DIAGNOSIS — Z30433 Encounter for removal and reinsertion of intrauterine contraceptive device: Secondary | ICD-10-CM | POA: Diagnosis not present

## 2023-04-27 DIAGNOSIS — R569 Unspecified convulsions: Secondary | ICD-10-CM | POA: Diagnosis not present

## 2023-04-30 DIAGNOSIS — S9031XD Contusion of right foot, subsequent encounter: Secondary | ICD-10-CM | POA: Diagnosis not present

## 2023-05-03 DIAGNOSIS — S9031XA Contusion of right foot, initial encounter: Secondary | ICD-10-CM | POA: Diagnosis not present

## 2023-05-07 DIAGNOSIS — S9031XA Contusion of right foot, initial encounter: Secondary | ICD-10-CM | POA: Insufficient documentation

## 2023-05-17 ENCOUNTER — Ambulatory Visit: Payer: 59 | Admitting: Licensed Clinical Social Worker

## 2023-05-17 DIAGNOSIS — F411 Generalized anxiety disorder: Secondary | ICD-10-CM

## 2023-05-17 DIAGNOSIS — F3181 Bipolar II disorder: Secondary | ICD-10-CM

## 2023-05-17 NOTE — Progress Notes (Addendum)
 Counselor/Therapist Progress Note  Patient ID: Taylor Oneill, MRN: [id number],    Date: 05/17/2023  Time Spent: 40 I spent 36 minutes on the real-time Face to face  with the patient on the date of service. I spent an additional 4 minutes on pre- and post-visit activities on the date of service including collateral, chart review, team discussion, and documentation.    Treatment Type: Psychotherapy  Reported Symptoms:  Overall mood stability; managed anxiety  Mental Status Exam:  Appearance:   Casual and Neat     Behavior:  Appropriate, Sharing, and Motivated  Motor:  Normal  Speech/Language:   Clear and Coherent and Normal Rate  Affect:  Appropriate, Congruent, and Full Range  Mood:  normal  Thought process:  normal  Thought content:    WNL  Sensory/Perceptual disturbances:    WNL  Orientation:  oriented to person, place, time/date, situation, and day of week  Attention:  Good  Concentration:  Good  Memory:  WNL  Fund of knowledge:   Good  Insight:    Fair  Judgment:   Fair  Impulse Control:  Fair   Risk Assessment: Danger to Self:  No Self-injurious Behavior: No Danger to Others: No Duty to Warn:no Physical Aggression / Violence:No  Access to Firearms a concern: No  Gang Involvement:No   Subjective: Patient was receptive to feedback and intervention from LCSW and actively and effectively participated throughout the session. Patient reports overall mood stability, having a good holiday and reporting having a lot of changes, including completed college degree, resigned from job and looking for new employment. She shares that she is anxious about the carrier change but feels really good about her support system. Patient reports continued motivation for treatment.    Interventions: Cognitive Behavioral Therapy and Client Centered  LCSW met with patient to identify needs related to stressors and functioning, and assess and monitor for signs and symptoms of bipolar disorder  and anxiety, and assess safety. Checked in with patient regarding how they have been doing since the last follow-up session. LCSW engaged patient in values exploration regarding carrer and intimate partner relationship, assisting patient in identifying and increasing awareness of what is important to her.Provided support through active listening, validation of feelings, and highlighted patient's strengths.  Diagnosis:   ICD-10-CM   1. Bipolar II disorder (HCC)  F31.81     2. Generalized anxiety disorder  F41.1      Plan:  Patient's goal of treatment is to get better at regulating her emotions.    Treatment Target: Understand the relationship between thoughts, emotions, and behaviors  Psychoeducation on CBT model   Oriented the client to the therapeutic approach Teach the connection between thoughts, emotions, and behaviors    Treatment Target: Increase realistic balanced thinking -to learn how to replace thinking with thoughts that are more accurate or helpful Explore patient's thoughts, beliefs, automatic thoughts, assumptions  Identify and replace unhelpful thinking patterns (upsetting ideas, self-talk and mental images) Process distress and allow for emotional release  Questioning and challenging thoughts Cognitive reappraisal  Restructuring, Socratic questioning    Treatment Target: Reducing vulnerability to "emotional mind" and increase mood regulation  Radical Acceptance  Values clarification   Self-care - nutrition, sleep, exercise  Mindfulness Approaches    Treatment Target: Increase psychological flexibility (Acceptance, defusion, observer self/perspective-taking, present moment awareness, values and committed action) Teach mindfulness skills  Psychoeducation about cognitive fusion- Hand as thoughts metaphor  Increase awareness of control vs. acceptance Reduce avoidance of certain emotions  or situations Discuss "unhooking" and emphasize that thoughts and feelings do not  always lead to action Increasing self-knowledge  Getting in touch with the observant self Values-guided behavioral change Future Appointments  Date Time Provider Department Center  05/31/2023 11:30 AM Ellender Palma, LCSW [facility] None      Palma Ellender, LCSW

## 2023-05-21 DIAGNOSIS — S9031XD Contusion of right foot, subsequent encounter: Secondary | ICD-10-CM | POA: Diagnosis not present

## 2023-05-28 DIAGNOSIS — S9031XD Contusion of right foot, subsequent encounter: Secondary | ICD-10-CM | POA: Diagnosis not present

## 2023-05-31 ENCOUNTER — Ambulatory Visit: Payer: 59 | Admitting: Licensed Clinical Social Worker

## 2023-05-31 DIAGNOSIS — F411 Generalized anxiety disorder: Secondary | ICD-10-CM

## 2023-05-31 DIAGNOSIS — F3181 Bipolar II disorder: Secondary | ICD-10-CM

## 2023-05-31 NOTE — Progress Notes (Signed)
Counselor/Therapist Progress Note  Patient ID: Taylor Oneill, MRN: [id number],    Date: 05/31/2023  Time Spent: 45 minutes. I spent 40 minutes Face to face with the patient on the date of service. I spent an additional 5 minutes on pre- and post-visit activities on the date of service including collateral, chart review, team discussion, and documentation.     Treatment Type: Psychotherapy  Reported Symptoms:  Overall stable mood; anxiety, anxious thoughts, worries  Mental Status Exam:  Appearance:   Casual, Neat, and Well Groomed     Behavior:  Appropriate, Sharing, and Motivated  Motor:  Normal  Speech/Language:   Clear and Coherent and Normal Rate  Affect:  Appropriate, Congruent, and Full Range  Mood:  normal  Thought process:  normal  Thought content:    WNL  Sensory/Perceptual disturbances:    WNL  Orientation:  oriented to person, place, time/date, situation, and day of week  Attention:  Good  Concentration:  Good  Memory:  WNL  Fund of knowledge:   Good  Insight:    Fair  Judgment:   Fair  Impulse Control:  Fair   Risk Assessment: Danger to Self:  No Self-injurious Behavior: No Danger to Others: No Duty to Warn:no Physical Aggression / Violence:No  Access to Firearms a concern: No  Gang Involvement:No   Subjective: Patient was engaged and cooperative throughout the session using time effectively to discuss thoughts and feelings. Patient reports that she has been anxious about finding a new job, but has kept herself occupied with crafting and spending time with supportive friends and family.  Patient was receptive to feedback and intervention from LCSW.   Interventions: Cognitive Behavioral Therapy and Client Centered  LCSW checked in with patient regarding how they have been doing since the last follow-up session. Explored patient's experience of anxiety about finding employment, validating her feelings, reframing unhelpful thoughts, and reviewing engagement in  activities to distract herself. LCSW also assisted patient in processing their thoughts and emotions regarding dating challenges. LCSW validated her feelings of sadness and fear, reframed thoughts leading to increased distress, and discussed her values related to intimate partner relationships. LCSW encouraged patient to continue to engage in supportive relationships and with activities she finds benefit her.    Diagnosis:   ICD-10-CM   1. Bipolar II disorder (HCC)  F31.81     2. Generalized anxiety disorder  F41.1      Plan: Patient's goal of treatment is to get better at regulating her emotions.    Treatment Target: Understand the relationship between thoughts, emotions, and behaviors  Psychoeducation on CBT model   Oriented the client to the therapeutic approach Teach the connection between thoughts, emotions, and behaviors    Treatment Target: Increase realistic balanced thinking -to learn how to replace thinking with thoughts that are more accurate or helpful Explore patient's thoughts, beliefs, automatic thoughts, assumptions  Identify and replace unhelpful thinking patterns (upsetting ideas, self-talk and mental images) Process distress and allow for emotional release  Questioning and challenging thoughts Cognitive reappraisal  Restructuring, Socratic questioning    Treatment Target: Reducing vulnerability to "emotional mind" and increase mood regulation  Radical Acceptance  Values clarification   Self-care - nutrition, sleep, exercise  Mindfulness Approaches    Treatment Target: Increase psychological flexibility (Acceptance, defusion, observer self/perspective-taking, present moment awareness, values and committed action) Teach mindfulness skills  Psychoeducation about cognitive fusion- Hand as thoughts metaphor  Increase awareness of control vs. acceptance Reduce avoidance of certain emotions or situations  Discuss "unhooking" and emphasize that thoughts and feelings do  not always lead to action Increasing self-knowledge  Getting in touch with the observant self Values-guided behavioral change  Future Appointments  Date Time Provider Department Center  06/14/2023 10:30 AM Kathreen Cosier, LCSW [facility] None    Kathreen Cosier, LCSW

## 2023-06-04 DIAGNOSIS — S9031XD Contusion of right foot, subsequent encounter: Secondary | ICD-10-CM | POA: Diagnosis not present

## 2023-06-07 DIAGNOSIS — T8332XA Displacement of intrauterine contraceptive device, initial encounter: Secondary | ICD-10-CM | POA: Diagnosis not present

## 2023-06-08 DIAGNOSIS — F902 Attention-deficit hyperactivity disorder, combined type: Secondary | ICD-10-CM | POA: Diagnosis not present

## 2023-06-08 DIAGNOSIS — F411 Generalized anxiety disorder: Secondary | ICD-10-CM | POA: Diagnosis not present

## 2023-06-08 DIAGNOSIS — G40209 Localization-related (focal) (partial) symptomatic epilepsy and epileptic syndromes with complex partial seizures, not intractable, without status epilepticus: Secondary | ICD-10-CM | POA: Diagnosis not present

## 2023-06-08 DIAGNOSIS — F3181 Bipolar II disorder: Secondary | ICD-10-CM | POA: Diagnosis not present

## 2023-06-11 DIAGNOSIS — S9031XD Contusion of right foot, subsequent encounter: Secondary | ICD-10-CM | POA: Diagnosis not present

## 2023-06-14 ENCOUNTER — Ambulatory Visit: Payer: 59 | Admitting: Licensed Clinical Social Worker

## 2023-06-14 DIAGNOSIS — F3181 Bipolar II disorder: Secondary | ICD-10-CM

## 2023-06-14 DIAGNOSIS — F411 Generalized anxiety disorder: Secondary | ICD-10-CM

## 2023-06-14 NOTE — Progress Notes (Signed)
Counselor/Therapist Progress Note  Patient ID: Taylor Oneill, MRN: [id number],    Date: 06/14/2023  Time Spent: 45 total minutes. I spent 40 minutes to face with the patient on the date of service. I spent an additional 5 minutes on pre- and post-visit activities on the date of service including collateral, chart review, team discussion, and documentation.   Treatment Type: Psychotherapy  Reported Symptoms:  mild anxiety, anxious thoughts, worries; intermittent low mood and ruminating thoughts   Mental Status Exam:  Appearance:   Casual and Neat     Behavior:  Appropriate, Sharing, and Motivated  Motor:  Normal  Speech/Language:   Clear and Coherent and Normal Rate  Affect:  Appropriate, Congruent, and Full Range  Mood:  normal  Thought process:  normal  Thought content:    WNL  Sensory/Perceptual disturbances:    WNL  Orientation:  oriented to person, place, time/date, situation, and day of week  Attention:  Good  Concentration:  Good  Memory:  WNL  Fund of knowledge:   Good  Insight:    Fair  Judgment:   Fair  Impulse Control:  Fair   Risk Assessment: Danger to Self:  No Self-injurious Behavior: No Danger to Others: No Duty to Warn:no Physical Aggression / Violence:No  Access to Firearms a concern: No  Gang Involvement:No   Subjective: Patient was receptive to feedback and intervention from LCSW and actively and effectively participated throughout the session. Patient reports that overall she has been good, keeping self busy by going on dates and shares dates have been good. She also reports moments of low mood and anxiety related to not having a job.  Patient reports benefit from regular sessions and scheduled a follow up session.   Interventions: Cognitive Behavioral Therapy and Client Centered  Checked in with patient regarding how they have been doing since the last follow-up session. Engaged patient in processing current psychosocial stressors, not having a job.  Charted out patient's thoughts, emotions and behaviors, reframed unhelpful thoughts leading to increased distress, validated patient's feelings of anxiety, frustration and regret, and discussed behaviors that help her to cope with current challenges.   Diagnosis:   ICD-10-CM   1. Bipolar II disorder (HCC)  F31.81     2. Generalized anxiety disorder  F41.1      Plan: Patient's goal of treatment is to get better at regulating her emotions.    Treatment Target: Understand the relationship between thoughts, emotions, and behaviors  Psychoeducation on CBT model   Oriented the client to the therapeutic approach Teach the connection between thoughts, emotions, and behaviors    Treatment Target: Increase realistic balanced thinking -to learn how to replace thinking with thoughts that are more accurate or helpful Explore patient's thoughts, beliefs, automatic thoughts, assumptions  Identify and replace unhelpful thinking patterns (upsetting ideas, self-talk and mental images) Process distress and allow for emotional release  Questioning and challenging thoughts Cognitive reappraisal  Restructuring, Socratic questioning    Treatment Target: Reducing vulnerability to "emotional mind" and increase mood regulation  Radical Acceptance  Values clarification   Self-care - nutrition, sleep, exercise  Mindfulness Approaches    Treatment Target: Increase psychological flexibility (Acceptance, defusion, observer self/perspective-taking, present moment awareness, values and committed action) Teach mindfulness skills  Psychoeducation about cognitive fusion- Hand as thoughts metaphor  Increase awareness of control vs. acceptance Reduce avoidance of certain emotions or situations Discuss "unhooking" and emphasize that thoughts and feelings do not always lead to action Increasing self-knowledge  Getting in  touch with the observant self Values-guided behavioral change  Future Appointments  Date Time  Provider Department Center  06/28/2023 10:30 AM Kathreen Cosier, LCSW [facility] None    Kathreen Cosier, [place]

## 2023-06-19 DIAGNOSIS — T8332XA Displacement of intrauterine contraceptive device, initial encounter: Secondary | ICD-10-CM | POA: Diagnosis not present

## 2023-06-28 ENCOUNTER — Ambulatory Visit: Payer: 59 | Admitting: Licensed Clinical Social Worker

## 2023-06-28 DIAGNOSIS — F411 Generalized anxiety disorder: Secondary | ICD-10-CM

## 2023-06-28 DIAGNOSIS — F3181 Bipolar II disorder: Secondary | ICD-10-CM

## 2023-06-28 NOTE — Progress Notes (Signed)
Counselor/Therapist Progress Note  Patient ID: Taylor Oneill, MRN: [id number],    Date: 06/28/2023  Time Spent: 45 total minutes. I spent 40 minutes with the patient on the date of service. I spent an additional 5 minutes on pre- and post-visit activities on the date of service including collateral, chart review, team discussion, and documentation.    Treatment Type: Psychotherapy  Reported Symptoms:  Slight mood improvement since last session. Low Mood, mild anxiety, low motivation   Mental Status Exam:  Appearance:   Casual     Behavior:  Appropriate, Sharing, and Motivated  Motor:  Normal  Speech/Language:   Clear and Coherent and Normal Rate  Affect:  Appropriate, Congruent, and Full Range  Mood:  normal  Thought process:  normal  Thought content:    WNL  Sensory/Perceptual disturbances:    WNL  Orientation:  oriented to person, place, time/date, situation, and day of week  Attention:  Good  Concentration:  Good  Memory:  WNL  Fund of knowledge:   Good  Insight:    Good  Judgment:   Good  Impulse Control:  Good   Risk Assessment: Danger to Self:  No Self-injurious Behavior: No Danger to Others: No Duty to Warn:no Physical Aggression / Violence:No  Access to Firearms a concern: No  Gang Involvement:No   Subjective: Patient was engaged and cooperative throughout the session using time effectively to discuss thoughts and feelings. She voices intermittent low mood and anxiety over the past two weeks related to uncertainty with securing employment. She reports benefit from engaging with others and in activities she enjoys. The majority of the session was used to discuss optimism about securing employment, and discussing values related to intimate partner relationships. Patient was receptive to feedback and intervention from LCSW and voices continued motivation for treatment.     Interventions: Cognitive Behavioral Therapy and Client Centered  LCSW met with patient to  identify needs related to stressors and functioning, and assess and monitor for mood and anxiety signs and symptoms, and assess safety. Checked in with patient regarding how they have been doing since the last follow-up session. Reviewed patient's value related to intimate partner relationships, discussed boundaries and open communication, and reviewed observing anxiety and the willingness to experience difficult thoughts and feelings to increase valued-living which decreases patient's overall distress.   Diagnosis:   ICD-10-CM   1. Bipolar II disorder (HCC)  F31.81     2. Generalized anxiety disorder  F41.1      Plan: Patient's goal of treatment is to get better at regulating her emotions.    Treatment Target: Understand the relationship between thoughts, emotions, and behaviors  Psychoeducation on CBT model   Oriented the client to the therapeutic approach Teach the connection between thoughts, emotions, and behaviors    Treatment Target: Increase realistic balanced thinking -to learn how to replace thinking with thoughts that are more accurate or helpful Explore patient's thoughts, beliefs, automatic thoughts, assumptions  Identify and replace unhelpful thinking patterns (upsetting ideas, self-talk and mental images) Process distress and allow for emotional release  Questioning and challenging thoughts Cognitive reappraisal  Restructuring, Socratic questioning    Treatment Target: Reducing vulnerability to "emotional mind" and increase mood regulation  Radical Acceptance  Values clarification   Self-care - nutrition, sleep, exercise  Mindfulness Approaches    Treatment Target: Increase psychological flexibility (Acceptance, defusion, observer self/perspective-taking, present moment awareness, values and committed action) Teach mindfulness skills  Psychoeducation about cognitive fusion- Hand as thoughts metaphor  Increase awareness of control vs. acceptance Reduce avoidance of  certain emotions or situations Discuss "unhooking" and emphasize that thoughts and feelings do not always lead to action Increasing self-knowledge  Getting in touch with the observant self Values-guided behavioral change  Future Appointments  Date Time Provider Department Center  07/12/2023 11:10 AM Kathreen Cosier, LCSW [facility] None    Kathreen Cosier, LCSW

## 2023-07-12 ENCOUNTER — Ambulatory Visit: Payer: 59 | Admitting: Licensed Clinical Social Worker

## 2023-07-12 DIAGNOSIS — F411 Generalized anxiety disorder: Secondary | ICD-10-CM

## 2023-07-12 DIAGNOSIS — F3181 Bipolar II disorder: Secondary | ICD-10-CM

## 2023-07-12 NOTE — Progress Notes (Signed)
 Counselor/Therapist Progress Note  Patient ID: Taylor Oneill, MRN: [id number],    Date: 07/12/2023  Time Spent: 35 total minutes. I spent 30 minutes face to face with the patient on the date of service. I spent an additional 5 minutes on pre- and post-visit activities on the date of service including collateral, chart review, team discussion, and documentation.     Treatment Type: Psychotherapy  Reported Symptoms:  Stable mood; mild anxiety   Mental Status Exam:  Appearance:   Casual     Behavior:  Appropriate, Sharing, and Motivated  Motor:  Normal  Speech/Language:   Clear and Coherent and Normal Rate  Affect:  Appropriate, Congruent, and Full Range  Mood:  euthymic  Thought process:  normal  Thought content:    WNL  Sensory/Perceptual disturbances:    WNL  Orientation:  oriented to person, place, time/date, situation, and day of week  Attention:  Good  Concentration:  Good  Memory:  WNL  Fund of knowledge:   Good  Insight:    Fair  Judgment:   Fair  Impulse Control:  Fair   Risk Assessment: Danger to Self:  No Self-injurious Behavior: No Danger to Others: No Duty to Warn:no Physical Aggression / Violence:No  Access to Firearms a concern: No  Gang Involvement:No   Subjective: Patient was engaged and cooperative throughout the session using time effectively to discuss thoughts and feelings. She shares that her mood is stable, has made progress regarding securing employment and is navigating the possibility of committing to a romantic partner. Patient does report some mild anxiety and excitement around current situations. Patient voices agreement to lengthen time between sessions due to increased consistency with mood.  Interventions: Cognitive Behavioral Therapy and Client Centered  LCSW met with patient to identify needs related to stressors and functioning, and assess and monitor for mood and anxiety symptoms and assess safety. Checked in with patient regarding how  they have been doing since the last follow-up session. LCSW assisted patient in processing their thoughts and emotions regarding securing new employment and in regards to new romantic partner. LCSW led patient in brief somatic processing cuing patient to notice thoughts, emotions and felt sensations. Provided support through active listening, validation of feelings, and highlighted patient's strengths.  Diagnosis:   ICD-10-CM   1. Bipolar II disorder (HCC)  F31.81     2. Generalized anxiety disorder  F41.1      Plan:  Patient's goal of treatment is to get better at regulating her emotions.    Treatment Target: Understand the relationship between thoughts, emotions, and behaviors  Psychoeducation on CBT model   Oriented the client to the therapeutic approach Teach the connection between thoughts, emotions, and behaviors    Treatment Target: Increase realistic balanced thinking -to learn how to replace thinking with thoughts that are more accurate or helpful Explore patient's thoughts, beliefs, automatic thoughts, assumptions  Identify and replace unhelpful thinking patterns (upsetting ideas, self-talk and mental images) Process distress and allow for emotional release  Questioning and challenging thoughts Cognitive reappraisal  Restructuring, Socratic questioning    Treatment Target: Reducing vulnerability to "emotional mind" and increase mood regulation  Radical Acceptance  Values clarification   Self-care - nutrition, sleep, exercise  Mindfulness Approaches    Treatment Target: Increase psychological flexibility (Acceptance, defusion, observer self/perspective-taking, present moment awareness, values and committed action) Teach mindfulness skills  Psychoeducation about cognitive fusion- Hand as thoughts metaphor  Increase awareness of control vs. acceptance Reduce avoidance of certain emotions or  situations Discuss "unhooking" and emphasize that thoughts and feelings do not always  lead to action Increasing self-knowledge  Getting in touch with the observant self Values-guided behavioral change  Future Appointments  Date Time Provider Department Center  08/09/2023 11:10 AM Kathreen Cosier, LCSW [facility] None    Kathreen Cosier, LCSW

## 2023-07-18 ENCOUNTER — Encounter: Payer: Self-pay | Admitting: Family Medicine

## 2023-07-18 ENCOUNTER — Ambulatory Visit (INDEPENDENT_AMBULATORY_CARE_PROVIDER_SITE_OTHER): Admitting: Family Medicine

## 2023-07-18 VITALS — BP 119/79 | HR 76 | Temp 98.2°F | Ht 67.0 in | Wt 194.2 lb

## 2023-07-18 DIAGNOSIS — Z23 Encounter for immunization: Secondary | ICD-10-CM

## 2023-07-18 DIAGNOSIS — Z0000 Encounter for general adult medical examination without abnormal findings: Secondary | ICD-10-CM

## 2023-07-18 DIAGNOSIS — Z7689 Persons encountering health services in other specified circumstances: Secondary | ICD-10-CM | POA: Diagnosis not present

## 2023-07-18 DIAGNOSIS — N912 Amenorrhea, unspecified: Secondary | ICD-10-CM | POA: Insufficient documentation

## 2023-07-18 NOTE — Patient Instructions (Signed)
 Health Maintenance Recommendations Screening Testing Mammogram Every 1 -2 years based on history and risk factors Starting at age 29 Pap Smear Ages 21-39 every 3 years Ages 68-65 every 5 years with HPV testing More frequent testing may be required based on results and history Colon Cancer Screening Every 1-10 years based on test performed, risk factors, and history Starting at age 47 Bone Density Screening Every 2-10 years based on history Starting at age 67 for women Recommendations for men differ based on medication usage, history, and risk factors AAA Screening One time ultrasound Men 84-53 years old who have every smoked Lung Cancer Screening Low Dose Lung CT every 12 months Age 35-80 years with a 30 pack-year smoking history who still smoke or who have quit within the last 15 years   Screening Labs Routine  Labs: Complete Blood Count (CBC), Complete Metabolic Panel (CMP), Cholesterol (Lipid Panel) Every 6-12 months based on history and medications May be recommended more frequently based on current conditions or previous results Hemoglobin A1c Lab Every 3-12 months based on history and previous results Starting at age 56 or earlier with diagnosis of diabetes, high cholesterol, BMI >26, and/or risk factors Frequent monitoring for patients with diabetes to ensure blood sugar control Thyroid Panel (TSH w/ T3 & T4) Every 6 months based on history, symptoms, and risk factors May be repeated more often if on medication HIV One time testing for all patients 63 and older May be repeated more frequently for patients with increased risk factors or exposure Hepatitis C One time testing for all patients 64 and older May be repeated more frequently for patients with increased risk factors or exposure Gonorrhea, Chlamydia Every 12 months for all sexually active persons 13-24 years Additional monitoring may be recommended for those who are considered high risk or who have  symptoms PSA Men 60-7 years old with risk factors Additional screening may be recommended from age 52-69 based on risk factors, symptoms, and history   Vaccine Recommendations Tetanus Booster All adults every 10 years Flu Vaccine All patients 6 months and older every year COVID Vaccine All patients 12 years and older Initial dosing with booster May recommend additional booster based on age and health history HPV Vaccine 2 doses all patients age 45-26 Dosing may be considered for patients over 26 Shingles Vaccine (Shingrix) 2 doses all adults 55 years and older Pneumonia (Pneumovax 23) All adults 65 years and older May recommend earlier dosing based on health history Pneumonia (Prevnar 10) All adults 65 years and older Dosed 1 year after Pneumovax 23   Additional Screening, Testing, and Vaccinations may be recommended on an individualized basis based on family history, health history, risk factors, and/or exposure.  __________________________________________________________   Diet Recommendations for All Patients   I recommend that all patients maintain a diet low in saturated fats, carbohydrates, and cholesterol. While this can be challenging at first, it is not impossible and small changes can make big differences.  Things to try: Decreasing the amount of soda, sweet tea, and/or juice to one or less per day and replace with water While water is always the first choice, if you do not like water you may consider adding a water additive without sugar to improve the taste other sugar free drinks Replace potatoes with a brightly colored vegetable at dinner Use healthy oils, such as canola oil or olive oil, instead of butter or hard margarine Limit your bread intake to two pieces or less a day Replace regular pasta with  low carb pasta options Bake, broil, or grill foods instead of frying Monitor portion sizes  Eat smaller, more frequent meals throughout the day instead of large  meals   An important thing to remember is, if you love foods that are not great for your health, you don't have to give them up completely. Instead, allow these foods to be a reward when you have done well. Allowing yourself to still have special treats every once in a while is a nice way to tell yourself thank you for working hard to keep yourself healthy.    Also remember that every day is a new day. If you have a bad day and "fall off the wagon", you can still climb right back up and keep moving along on your journey!   We have resources available to help you!  Some websites that may be helpful include: www.http://www.wall-moore.info/        Www.VeryWellFit.com _____________________________________________________________   Activity Recommendations for All Patients   I recommend that all adults get at least 20 minutes of moderate physical activity that elevates your heart rate at least 5 days out of the week.  Some examples include: Walking or jogging at a pace that allows you to carry on a conversation Cycling (stationary bike or outdoors) Water aerobics Yoga Weight lifting Dancing If physical limitations prevent you from putting stress on your joints, exercise in a pool or seated in a chair are excellent options.   Do determine your MAXIMUM heart rate for activity: YOUR AGE - 220 = MAX HeartRate    Remember! Do not push yourself too hard.  Start slowly and build up your pace, speed, weight, time in exercise, etc.  Allow your body to rest between exercise and get good sleep. You will need more water than normal when you are exerting yourself. Do not wait until you are thirsty to drink. Drink with a purpose of getting in at least 8, 8 ounce glasses of water a day plus more depending on how much you exercise and sweat.      If you begin to develop dizziness, chest pain, abdominal pain, jaw pain, shortness of breath, headache, vision changes, lightheadedness, or other concerning symptoms, stop the  activity and allow your body to rest. If your symptoms are severe, seek emergency evaluation immediately. If your symptoms are concerning, but not severe, please let us know so that we can recommend further evaluation.      MyChart:  For all urgent or time sensitive needs we ask that you please call the office to avoid delays. Our number is [phone]. MyChart is not constantly monitored and due to the large volume of messages a day, replies may take up to 72 business hours.   MyChart Policy: MyChart allows for you to see your visit notes, after visit summary, provider recommendations, lab and tests results, make an appointment, request refills, and contact your provider or the office for non-urgent questions or concerns. Providers are seeing patients during normal business hours and do not have built in time to review MyChart messages.  We ask that you allow a minimum of 3 business days for responses to [employer]. For this reason, please do not send urgent requests through MyChart. Please call the office at [phone]. New and ongoing conditions may require a visit. We have virtual and in person visit available for your convenience.  Complex MyChart concerns may require a visit. Your provider may request you schedule a virtual or in person visit  to ensure we are providing the best care possible. MyChart messages sent after 11:00 AM on Friday will not be received by the provider until Monday morning.    Lab and Test Results: You will receive your lab and test results on MyChart as soon as they are completed and results have been sent by the lab or testing facility. Due to this service, you will receive your results BEFORE your provider.  I review lab and tests results each morning prior to seeing patients. Some results require collaboration with other providers to ensure you are receiving the most appropriate care. For this reason, we ask that you please allow a minimum of 3-5 business  days from the time the ALL results have been received for your provider to receive and review lab and test results and contact you about these.  Most lab and test result comments from the provider will be sent through MyChart. Your provider may recommend changes to the plan of care, follow-up visits, repeat testing, ask questions, or request an office visit to discuss these results. You may reply directly to this message or call the office at [phone] to provide information for the provider or set up an appointment. In some instances, you will be called with test results and recommendations. Please let us know if this is preferred and we will make note of this in your chart to provide this for you.    If you have not heard a response to your lab or test results in 5 business days from all results returning to MyChart, please call the office to let us know. We ask that you please avoid calling prior to this time unless there is an emergent concern. Due to high call volumes, this can delay the resulting process.   After Hours: For all non-emergency after hours needs, please call the office at [phone] and select the option to reach the on-call provider service. On-call services are shared between multiple [facility] offices and therefore it will not be possible to speak directly with your provider. On-call providers may provide medical advice and recommendations, but are unable to provide refills for maintenance medications.  For all emergency or urgent medical needs after normal business hours, we recommend that you seek care at the closest Urgent Care or Emergency Department to ensure appropriate treatment in a timely manner.  [facility] has a 24 hour emergency room located on the ground floor for your convenience.    Urgent Concerns During the Business Day Providers are seeing patients from 8AM to 5PM, Monday through Thursday, and 8AM to 12PM on Friday with a busy  schedule and are most often not able to respond to non-urgent calls until the end of the day or the next business day. If you should have URGENT concerns during the day, please call and speak to the nurse or schedule a same day appointment so that we can address your concern without delay.    Thank you, again, for choosing me as your health care partner. I appreciate your trust and look forward to learning more about you.    Alyson Reedy, FNP-C
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
## 2023-07-19 LAB — CBC WITH DIFFERENTIAL/PLATELET
Basophils Absolute: 0 10*3/uL (ref 0.0–0.2)
Basos: 0 %
EOS (ABSOLUTE): 0 10*3/uL (ref 0.0–0.4)
Eos: 0 %
Hematocrit: 40.3 % (ref 34.0–46.6)
Hemoglobin: 13 g/dL (ref 11.1–15.9)
Immature Grans (Abs): 0 10*3/uL (ref 0.0–0.1)
Immature Granulocytes: 0 %
Lymphocytes Absolute: 2.5 10*3/uL (ref 0.7–3.1)
Lymphs: 27 %
MCH: 29.1 pg (ref 26.6–33.0)
MCHC: 32.3 g/dL (ref 31.5–35.7)
MCV: 90 fL (ref 79–97)
Monocytes Absolute: 0.7 10*3/uL (ref 0.1–0.9)
Monocytes: 7 %
Neutrophils Absolute: 5.8 10*3/uL (ref 1.4–7.0)
Neutrophils: 66 %
Platelets: 344 10*3/uL (ref 150–450)
RBC: 4.47 x10E6/uL (ref 3.77–5.28)
RDW: 12.5 % (ref 11.7–15.4)
WBC: 9 10*3/uL (ref 3.4–10.8)

## 2023-07-19 LAB — HEMOGLOBIN A1C
Est. average glucose Bld gHb Est-mCnc: 114 mg/dL
Hgb A1c MFr Bld: 5.6 % (ref 4.8–5.6)

## 2023-07-19 LAB — COMPREHENSIVE METABOLIC PANEL
ALT: 82 IU/L — ABNORMAL HIGH (ref 0–32)
AST: 260 IU/L — ABNORMAL HIGH (ref 0–40)
Albumin: 4.4 g/dL (ref 4.0–5.0)
Alkaline Phosphatase: 60 IU/L (ref 44–121)
BUN/Creatinine Ratio: 11 (ref 9–23)
BUN: 7 mg/dL (ref 6–20)
Bilirubin Total: 0.3 mg/dL (ref 0.0–1.2)
CO2: 22 mmol/L (ref 20–29)
Calcium: 9.6 mg/dL (ref 8.7–10.2)
Chloride: 104 mmol/L (ref 96–106)
Creatinine, Ser: 0.62 mg/dL (ref 0.57–1.00)
Globulin, Total: 2.4 g/dL (ref 1.5–4.5)
Glucose: 81 mg/dL (ref 70–99)
Potassium: 4.6 mmol/L (ref 3.5–5.2)
Sodium: 140 mmol/L (ref 134–144)
Total Protein: 6.8 g/dL (ref 6.0–8.5)
eGFR: 124 mL/min/{1.73_m2} (ref >59)

## 2023-07-19 LAB — TSH RFX ON ABNORMAL TO FREE T4: TSH: 1.84 u[IU]/mL (ref 0.450–4.500)

## 2023-07-19 LAB — LIPID PANEL
Chol/HDL Ratio: 3 ratio (ref 0.0–4.4)
Cholesterol, Total: 169 mg/dL (ref 100–199)
HDL: 56 mg/dL (ref >39)
LDL Chol Calc (NIH): 97 mg/dL (ref 0–99)
Triglycerides: 85 mg/dL (ref 0–149)
VLDL Cholesterol Cal: 16 mg/dL (ref 5–40)

## 2023-07-25 ENCOUNTER — Encounter: Payer: Self-pay | Admitting: Family Medicine

## 2023-08-09 ENCOUNTER — Ambulatory Visit: Payer: 59 | Admitting: Licensed Clinical Social Worker

## 2023-08-16 NOTE — Progress Notes (Signed)
 08-16-2023 Received a request for medical records from Disability Determination Services for patient Taylor Oneill, DOB: 1995-08-04 for disability benefits for dates of service 05-15-2022 to present. Per review of signed authorization, copies of [facility] Department ([facility]) were released and mailed to Disability Determination Services at [place] [facility] [address], [address], [place], [place] [address]. Copies of [facility] medical records include LCSW records for the following visits: Counselor/therapist progress notes for dates 05-18-2022, 06-22-2022, 07-06-2022, 07-20-2022, 08-03-2022, 08-24-2022, 09-07-2022, 09-21-2022, 09-27-2022, 10-11-2022, 10-26-2022, 11-08-2022, 11-22-2022, 12-06-2022, 01-04-2023, 01-18-2023, 02-28-2023, 03-21-2023, 04-19-2023, 05-17-2023, 05-31-2023, 06-14-2023, 06-28-2023, and 07-12-2023. Herby Abraham RN.

## 2023-08-21 NOTE — Progress Notes (Signed)
 08-21-2023 Received second request for medical records from Disability Determination Services for patient Taylor Oneill, dob: 06-25-1995. Telephone call to Disability Determination Services at [phone]. Left message on voice mail stating that medical records had been mailed 08-16-2023. Herby Abraham RN.

## 2023-08-22 ENCOUNTER — Ambulatory Visit: Admitting: Licensed Clinical Social Worker

## 2023-08-22 DIAGNOSIS — F411 Generalized anxiety disorder: Secondary | ICD-10-CM

## 2023-08-22 DIAGNOSIS — F3181 Bipolar II disorder: Secondary | ICD-10-CM

## 2023-08-22 NOTE — Progress Notes (Signed)
 Counselor/Therapist Progress Note  Patient ID: Taylor Oneill, MRN: [id number],    Date: 08/22/2023  Time Spent: 52 total minutes. I spent 47 minutes on real-time audio and video OR face to face with the patient on the date of service. I spent an additional 5 minutes on pre- and post-visit activities on the date of service including collateral, chart review, team discussion, and documentation.     Treatment Type: Psychotherapy  Reported Symptoms:  Overall mood is "good"   Mental Status Exam:  Appearance:   Casual     Behavior:  Appropriate, Sharing, and Motivated  Motor:  Normal  Speech/Language:   Clear and Coherent and Normal Rate  Affect:  Appropriate, Congruent, and Full Range  Mood:  normal  Thought process:  normal  Thought content:    WNL  Sensory/Perceptual disturbances:    WNL  Orientation:  oriented to person, place, time/date, situation, and day of week  Attention:  Good  Concentration:  Good  Memory:  WNL  Fund of knowledge:   Good  Insight:    Good  Judgment:   Good  Impulse Control:  Good   Risk Assessment: Danger to Self:  No Self-injurious Behavior: No Danger to Others: No Duty to Warn:no Physical Aggression / Violence:No  Access to Firearms a concern: No  Gang Involvement:No   Subjective: Patient reports that her mood has been good. She shared that her new job is going well and most of all feels benefit from the overall routine and schedule of this job. She also shares that she sometimes becomes "over stimulated" at work. Patient was receptive to feedback and intervention from LCSW and actively and effectively participated throughout the session. Patient is likely to benefit from future treatment because she remains motivated to decrease symptoms and improve functioning, but voices agreement in plan to begin terminating process before [facility] begins billing in July.     Interventions: Cognitive Behavioral Therapy and client centered  LCSW met with patient  to identify needs related to stressors and functioning, and assess and monitor for mood and anxiety symptoms, and assess safety. Checked in with patient regarding how they have been doing since the last follow-up session. LCSW explored patient's experience with recent break-up and start of new job. LCSW intervened with positive regard and optimism to validate client's emotions, and supported client in exploring ways to manage overwhelm and activation at work, including reviewing mindfulness, beginners mind, curiosity, intentional breathing, and reframed unhelpful thoughts leading to increased distress/anxiety. Provided support through active listening, validation of feelings, and highlighted patient's strengths.  Diagnosis:   ICD-10-CM   1. Bipolar II disorder (HCC)  F31.81     2. Generalized anxiety disorder  F41.1      Plan: Patient's goal of treatment is to get better at regulating her emotions.    Treatment Target: Understand the relationship between thoughts, emotions, and behaviors  Psychoeducation on CBT model   Oriented the client to the therapeutic approach Teach the connection between thoughts, emotions, and behaviors    Treatment Target: Increase realistic balanced thinking -to learn how to replace thinking with thoughts that are more accurate or helpful Explore patient's thoughts, beliefs, automatic thoughts, assumptions  Identify and replace unhelpful thinking patterns (upsetting ideas, self-talk and mental images) Process distress and allow for emotional release  Questioning and challenging thoughts Cognitive reappraisal  Restructuring, Socratic questioning    Treatment Target: Reducing vulnerability to "emotional mind" and increase mood regulation  Radical Acceptance  Values clarification  Self-care - nutrition, sleep, exercise  Mindfulness Approaches    Treatment Target: Increase psychological flexibility (Acceptance, defusion, observer self/perspective-taking, present  moment awareness, values and committed action) Teach mindfulness skills  Psychoeducation about cognitive fusion- Hand as thoughts metaphor  Increase awareness of control vs. acceptance Reduce avoidance of certain emotions or situations Discuss "unhooking" and emphasize that thoughts and feelings do not always lead to action Increasing self-knowledge  Getting in touch with the observant self Values-guided behavioral change  Future Appointments  Date Time Provider Department Center  09/05/2023  3:10 PM Kathreen Cosier, LCSW [facility] None     Kathreen Cosier, LCSW

## 2023-08-25 DIAGNOSIS — Z419 Encounter for procedure for purposes other than remedying health state, unspecified: Secondary | ICD-10-CM | POA: Diagnosis not present

## 2023-08-31 DIAGNOSIS — Z79899 Other long term (current) drug therapy: Secondary | ICD-10-CM | POA: Diagnosis not present

## 2023-08-31 DIAGNOSIS — F3181 Bipolar II disorder: Secondary | ICD-10-CM | POA: Diagnosis not present

## 2023-08-31 DIAGNOSIS — F902 Attention-deficit hyperactivity disorder, combined type: Secondary | ICD-10-CM | POA: Diagnosis not present

## 2023-08-31 DIAGNOSIS — F411 Generalized anxiety disorder: Secondary | ICD-10-CM | POA: Diagnosis not present

## 2023-08-31 DIAGNOSIS — G40209 Localization-related (focal) (partial) symptomatic epilepsy and epileptic syndromes with complex partial seizures, not intractable, without status epilepticus: Secondary | ICD-10-CM | POA: Diagnosis not present

## 2023-09-05 ENCOUNTER — Ambulatory Visit: Admitting: Licensed Clinical Social Worker

## 2023-09-19 ENCOUNTER — Encounter: Payer: Self-pay | Admitting: Licensed Clinical Social Worker

## 2023-09-19 ENCOUNTER — Ambulatory Visit: Admitting: Licensed Clinical Social Worker

## 2023-09-19 DIAGNOSIS — F411 Generalized anxiety disorder: Secondary | ICD-10-CM

## 2023-09-19 DIAGNOSIS — F3181 Bipolar II disorder: Secondary | ICD-10-CM

## 2023-09-19 NOTE — Progress Notes (Signed)
 Counselor/Therapist Progress Note  Patient ID: Taylor Oneill, MRN: [id number],    Date: 09/19/2023  Time Spent: 55 total minutes. I spent 45 minutes face to face with the patient on the date of service. I spent an additional 10  minutes on pre- and post-visit activities on the date of service including collateral, chart review, team discussion, and documentation.   Treatment Type: Psychotherapy  Reported Symptoms:  intermittent sadness and low mood; low energy, anxiety, anxious thoughts  Mental Status Exam:  Appearance:   Casual     Behavior:  Appropriate, Sharing, and Motivated  Motor:  Normal  Speech/Language:   Clear and Coherent and Normal Rate  Affect:  Appropriate, Congruent, Full Range, and Tearful  Mood:  dysthymic  Thought process:  normal  Thought content:    WNL  Sensory/Perceptual disturbances:    WNL  Orientation:  oriented to person, place, time/date, situation, and day of week  Attention:  Good  Concentration:  Good  Memory:  WNL  Fund of knowledge:   Good  Insight:    Fair  Judgment:   Good  Impulse Control:  Good   Risk Assessment: Danger to Self:  No Self-injurious Behavior: No Danger to Others: No Duty to Warn:no Physical Aggression / Violence:No  Access to Firearms a concern: No  Gang Involvement:No   Subjective: Patient reports overall enjoyment in her current job but notes decreased social engagement, attributing it to low energy potentially related to work demands or intermittent "waves of sadness" that lead to self-isolation. She describes ongoing struggles with "not feeling like an adult," difficulty adjusting to change--particularly in relation to searching for a summer job and the desire to move into her own space--and persistent feelings of loneliness. Despite reduced social interaction, the patient shared that she spent time with friends over the past weekend. She reports consistent use of mindfulness coloring after work as a strategy to  decompress and deep breathing throughout her workday. The patient was engaged, cooperative, and used session time effectively to explore her thoughts, emotions, and coping mechanisms. She was open to feedback and interventions provided by the LCSW. The patient expressed continued motivation for treatment and scheduled a follow-up session.  Interventions: Cognitive Behavioral Therapy and Client Centered  LCSW met with the patient to assess current needs related to psychosocial stressors and daily functioning. Patient was monitored for symptoms and assessed for safety. A follow-up check-in was conducted to assess the patient's well-being since the previous session. The session provided a supportive environment that facilitated emotional expression and processing of current stressors, including feelings of not "feeling" like an adult, loneliness, and life changes. LCSW validated the patient's emotional experiences and explored these themes through normalization, linking to childhood, and cognitive reframing. Patient was encouraged to increase awareness of felt bodily sensations. Mindfulness strategies were reviewed, including non-judgmental awareness, the Heart Hug grounding skill, and intentional deep breathing techniques. LCSW discussed upcoming clinic changes and options to close out or reengage in treatment after doing another assessment and treatment plan. LCSW and patient determined that patient wanted another appointment and a decision would be made at that time.   Diagnosis:   ICD-10-CM   1. Bipolar II disorder (HCC)  F31.81     2. Generalized anxiety disorder  F41.1      Plan: Plan: Patient's goal of treatment is to get better at regulating her emotions.    Treatment Target: Understand the relationship between thoughts, emotions, and behaviors  Psychoeducation on CBT model   Oriented  the client to the therapeutic approach Teach the connection between thoughts, emotions, and behaviors     Treatment Target: Increase realistic balanced thinking -to learn how to replace thinking with thoughts that are more accurate or helpful Explore patient's thoughts, beliefs, automatic thoughts, assumptions  Identify and replace unhelpful thinking patterns (upsetting ideas, self-talk and mental images) Process distress and allow for emotional release  Questioning and challenging thoughts Cognitive reappraisal  Restructuring, Socratic questioning    Treatment Target: Reducing vulnerability to "emotional mind" and increase mood regulation  Radical Acceptance  Values clarification   Self-care - nutrition, sleep, exercise  Mindfulness Approaches    Treatment Target: Increase psychological flexibility (Acceptance, defusion, observer self/perspective-taking, present moment awareness, values and committed action) Teach mindfulness skills  Psychoeducation about cognitive fusion- Hand as thoughts metaphor  Increase awareness of control vs. acceptance Reduce avoidance of certain emotions or situations Discuss "unhooking" and emphasize that thoughts and feelings do not always lead to action Increasing self-knowledge  Getting in touch with the observant self Values-guided behavioral change  Future Appointments  Date Time Provider Department Center  10/10/2023  3:10 PM Nyle Belling, LCSW [facility] None    Nyle Belling, LCSW

## 2023-09-24 DIAGNOSIS — Z419 Encounter for procedure for purposes other than remedying health state, unspecified: Secondary | ICD-10-CM | POA: Diagnosis not present

## 2023-09-28 DIAGNOSIS — Z79899 Other long term (current) drug therapy: Secondary | ICD-10-CM | POA: Diagnosis not present

## 2023-10-10 ENCOUNTER — Ambulatory Visit: Admitting: Licensed Clinical Social Worker

## 2023-10-18 DIAGNOSIS — R569 Unspecified convulsions: Secondary | ICD-10-CM | POA: Diagnosis not present

## 2023-10-18 DIAGNOSIS — F32A Depression, unspecified: Secondary | ICD-10-CM | POA: Diagnosis not present

## 2023-10-18 DIAGNOSIS — E559 Vitamin D deficiency, unspecified: Secondary | ICD-10-CM | POA: Diagnosis not present

## 2023-10-18 DIAGNOSIS — F419 Anxiety disorder, unspecified: Secondary | ICD-10-CM | POA: Diagnosis not present

## 2023-10-25 ENCOUNTER — Ambulatory Visit: Admitting: Licensed Clinical Social Worker

## 2023-10-25 DIAGNOSIS — F3181 Bipolar II disorder: Secondary | ICD-10-CM

## 2023-10-25 DIAGNOSIS — Z419 Encounter for procedure for purposes other than remedying health state, unspecified: Secondary | ICD-10-CM | POA: Diagnosis not present

## 2023-10-25 DIAGNOSIS — F411 Generalized anxiety disorder: Secondary | ICD-10-CM

## 2023-10-25 NOTE — Progress Notes (Signed)
 Counselor/Therapist Progress Note  Patient ID: Taylor Oneill, MRN: [id number],    Date: 10/25/2023  Time Spent: 60 total minutes. I spent 55 minutes face to face with the patient on the date of service. I spent an additional 5 minutes on pre- and post-visit activities on the date of service including collateral, chart review, team discussion, and documentation.   Treatment Type: Psychotherapy  Reported Symptoms: Anxiety, anxious thoughts, worries  Mental Status Exam:  Appearance:   Casual     Behavior:  Appropriate, Sharing, and Motivated  Motor:  Normal  Speech/Language:   Clear and Coherent and Normal Rate  Affect:  Appropriate, Congruent, and Full Range  Mood:  normal  Thought process:  normal  Thought content:    WNL  Sensory/Perceptual disturbances:    WNL  Orientation:  oriented to person, place, time/date, situation, and day of week  Attention:  Good  Concentration:  Good  Memory:  WNL  Fund of knowledge:   Good  Insight:    Fair  Judgment:   Good  Impulse Control:  Good   Risk Assessment: Danger to Self:  No Self-injurious Behavior: No Danger to Others: No Duty to Warn:no Physical Aggression / Violence:No  Access to Firearms a concern: No  Gang Involvement:No   Subjective: Patient presents feeling stress and disheartened with car problems resulting in her needing a new car. She shares that she has been trying to cope by reminding herself that it is temporary, and things will get better. She also reports having social anxiety lately. She describes feeling anxious in public settings, worrying about others observing her and judging her. Patient was engaged and cooperative throughout the session using time effectively to discuss thoughts and feelings and coping strategies. Patient in agreement to close out treatment at next session.   Interventions: Cognitive Behavioral Therapy and Client Centered  LCSW met with patient to identify needs related to stressors and  functioning, and assess and monitor mood and anxiety symptoms, and assess safety. Checked in with patient regarding how they have been doing since the last follow-up session. LCSW assisted patient in processing their thoughts and emotions regarding car problems and her experience of anxiety at the gym. LCSW provided psychoeducation and therapeutic guidance on cognitive defusion techniques. The session focused on helping the client practice noticing thoughts and labeling them as "just thoughts," as well as recognizing and labeling physical sensations. These strategies were used to promote mindful awareness and reduce cognitive fusion, in line with Acceptance and Commitment Therapy (ACT) principles.  Diagnosis:   ICD-10-CM   1. Bipolar II disorder (HCC)  F31.81     2. Generalized anxiety disorder  F41.1      Plan: Patient's goal of treatment is to get better at regulating her emotions.    Treatment Target: Understand the relationship between thoughts, emotions, and behaviors  Psychoeducation on CBT model   Oriented the client to the therapeutic approach Teach the connection between thoughts, emotions, and behaviors    Treatment Target: Increase realistic balanced thinking -to learn how to replace thinking with thoughts that are more accurate or helpful Explore patient's thoughts, beliefs, automatic thoughts, assumptions  Identify and replace unhelpful thinking patterns (upsetting ideas, self-talk and mental images) Process distress and allow for emotional release  Questioning and challenging thoughts Cognitive reappraisal  Restructuring, Socratic questioning    Treatment Target: Reducing vulnerability to "emotional mind" and increase mood regulation  Radical Acceptance  Values clarification   Self-care - nutrition, sleep, exercise  Mindfulness  Approaches    Treatment Target: Increase psychological flexibility (Acceptance, defusion, observer self/perspective-taking, present moment  awareness, values and committed action) Teach mindfulness skills  Psychoeducation about cognitive fusion- Hand as thoughts metaphor  Increase awareness of control vs. acceptance Reduce avoidance of certain emotions or situations Discuss "unhooking" and emphasize that thoughts and feelings do not always lead to action Increasing self-knowledge  Getting in touch with the observant self Values-guided behavioral change  Future Appointments  Date Time Provider Department Center  11/07/2023  3:00 PM Nyle Belling, LCSW [facility] None    Nyle Belling, LCSW

## 2023-11-02 DIAGNOSIS — H5213 Myopia, bilateral: Secondary | ICD-10-CM | POA: Diagnosis not present

## 2023-11-07 ENCOUNTER — Ambulatory Visit: Admitting: Licensed Clinical Social Worker

## 2023-11-07 DIAGNOSIS — F3181 Bipolar II disorder: Secondary | ICD-10-CM

## 2023-11-07 DIAGNOSIS — F411 Generalized anxiety disorder: Secondary | ICD-10-CM

## 2023-11-07 NOTE — Progress Notes (Signed)
 Counselor/Therapist Progress Note  Patient ID: Taylor Oneill, MRN: [id number],    Date: 11/07/2023  Time Spent: 44 total minutes. I spent 39 minutes face to face with the patient on the date of service. I spent an additional 5 minutes on pre- and post-visit activities on the date of service including collateral, chart review, team discussion, and documentation.    Treatment Type: Psychotherapy  Reported Symptoms: Overall mood stability; Mild anxiety mostly managed   Mental Status Exam:  Appearance:   Casual     Behavior:  Appropriate, Sharing, and Motivated  Motor:  Normal  Speech/Language:   Clear and Coherent and Normal Rate  Affect:  Appropriate, Congruent, and Full Range  Mood:  euthymic  Thought process:  normal  Thought content:    WNL  Sensory/Perceptual disturbances:    WNL  Orientation:  oriented to person, place, time/date, situation, and day of week  Attention:  Good  Concentration:  Good  Memory:  WNL  Fund of knowledge:   Good  Insight:    Good  Judgment:   Good  Impulse Control:  Good   Risk Assessment: Danger to Self:  No Self-injurious Behavior: No Danger to Others: No Duty to Warn:no Physical Aggression / Violence:No  Access to Firearms a concern: No  Gang Involvement:No   Subjective: Patient reports that she has been good. Excited about her new job. She reports that overall her mood has been good; she reports some mild anxiety and worries at times but she is able to cope.   Patient was receptive to feedback and intervention from LCSW and actively and effectively participated throughout the session. Patient voices readiness to close out services and reports that she feels she is in a good space. She shares she has met her treatment goals and reports ongoing psychiatric med management which she reports she is benefiting from. Patient voices understanding that she can seek services in the future, if needed.   Interventions: Cognitive Behavioral Therapy  and Client Centered  LCSW met with the patient to identify needs related to stressors and overall functioning, while assessing and monitoring mood, anxiety symptoms, and safety. The clinician checked in on the patient's status since the last follow-up. LCSW joined with patient to set session agenda to discuss termination of services. Patient expressed readiness to terminate services and acknowledged understanding that she can seek services again in the future if needed. Treatment progress was reviewed, and relapse prevention planning was jointly reviewed, including coping skills and self-care strategies. The patient was encouraged to continue with psychiatric medication management.  Diagnosis:   ICD-10-CM   1. Bipolar II disorder (HCC)  F31.81     2. Generalized anxiety disorder  F41.1      Plan: Termination of services. Patient to seek services in the future, as needed.    No future appointments.   Alan Hail, LCSW

## 2023-11-21 DIAGNOSIS — F902 Attention-deficit hyperactivity disorder, combined type: Secondary | ICD-10-CM | POA: Diagnosis not present

## 2023-11-21 DIAGNOSIS — F3181 Bipolar II disorder: Secondary | ICD-10-CM | POA: Diagnosis not present

## 2023-11-21 DIAGNOSIS — F411 Generalized anxiety disorder: Secondary | ICD-10-CM | POA: Diagnosis not present

## 2023-11-21 DIAGNOSIS — G40209 Localization-related (focal) (partial) symptomatic epilepsy and epileptic syndromes with complex partial seizures, not intractable, without status epilepticus: Secondary | ICD-10-CM | POA: Diagnosis not present

## 2023-11-24 DIAGNOSIS — Z419 Encounter for procedure for purposes other than remedying health state, unspecified: Secondary | ICD-10-CM | POA: Diagnosis not present

## 2023-12-25 DIAGNOSIS — Z419 Encounter for procedure for purposes other than remedying health state, unspecified: Secondary | ICD-10-CM | POA: Diagnosis not present

## 2023-12-26 DIAGNOSIS — F411 Generalized anxiety disorder: Secondary | ICD-10-CM | POA: Diagnosis not present

## 2023-12-26 DIAGNOSIS — F3181 Bipolar II disorder: Secondary | ICD-10-CM | POA: Diagnosis not present

## 2023-12-26 DIAGNOSIS — F902 Attention-deficit hyperactivity disorder, combined type: Secondary | ICD-10-CM | POA: Diagnosis not present

## 2024-01-03 ENCOUNTER — Ambulatory Visit: Payer: Self-pay

## 2024-01-03 NOTE — Telephone Encounter (Signed)
 FYI Only or Action Required?: Action required by provider: request for appointment.  Patient was last seen in primary care on 07/18/2023 by Towana Small, FNP.  Called Nurse Triage reporting Rash.  Symptoms began a week ago.  Interventions attempted: OTC medications: hydrocortisone 1% cream.  Symptoms are: unchanged.  Triage Disposition: See Physician Within 24 Hours  Patient/caregiver understands and will follow disposition?: Yes        Copied from CRM #[id number]. Topic: Clinical - Red Word Triage >> Jan 03, 2024  4:21 PM Ivette P wrote: Red Word that prompted transfer to Nurse Triage: bump on leg, above left knee cap, also having a rash, inflamed. Around neck and right arm. No change to eat. Reason for Disposition  SEVERE itching (i.e., interferes with sleep, normal activities or school)  Answer Assessment - Initial Assessment Questions 1. APPEARANCE of RASH: What does the rash look like? (e.g., blisters, dry flaky skin, red spots, redness, sores)     Red, dry flaky, hard, scalely; their itchy, burns if it gets wet  2. SIZE: How big are the spots? (e.g., tip of pen, eraser, coin; inches, centimeters)     Size of quarter to size of palm  3. LOCATION: Where is the rash located?     Under jaw line, neck, right colar bone, inner crest of right elbow, elblw, right forearm  4. COLOR: What color is the rash? (Note: It is difficult to assess rash color in people with darker-colored skin. When this situation occurs, simply ask the caller to describe what they see.)     Bright red bump to pinkish, raised, elongated, no discharge, scabs from scratching  5. ONSET: When did the rash begin?     About 2 weeks ago Happened before 1 month ago   6. FEVER: Do you have a fever? If Yes, ask: What is your temperature, how was it measured, and when did it start?     No fever 7. ITCHING: Does the rash itch? If Yes, ask: How bad is the itch? (Scale 1-10; or mild, moderate,  severe)     6/10 8. CAUSE: What do you think is causing the rash?     unsure 9. MEDICINE FACTORS: Have you started any new medicines within the last 2 weeks? (e.g., antibiotics)      Taking Lamictal  for 7 years; Allergic to codeine; hives and vomiting, cockroaches; have not been around these allergens Applied Hydrocortisone 1%, no relief 10. OTHER SYMPTOMS: Do you have any other symptoms? (e.g., dizziness, headache, sore throat, joint pain)       Denies difficulty breathing, faint or dizzy,  11. PREGNANCY: Is there any chance you are pregnant? When was your last menstrual period?       Have IUD  in place  Protocols used: Rash or Redness - [facility]

## 2024-01-04 ENCOUNTER — Other Ambulatory Visit: Payer: Self-pay | Admitting: Family Medicine

## 2024-01-04 ENCOUNTER — Ambulatory Visit (INDEPENDENT_AMBULATORY_CARE_PROVIDER_SITE_OTHER): Admitting: Family Medicine

## 2024-01-04 ENCOUNTER — Encounter: Payer: Self-pay | Admitting: Family Medicine

## 2024-01-04 VITALS — BP 125/83 | HR 80 | Temp 98.6°F | Resp 20 | Ht 67.0 in | Wt 191.0 lb

## 2024-01-04 DIAGNOSIS — L308 Other specified dermatitis: Secondary | ICD-10-CM | POA: Diagnosis not present

## 2024-01-04 DIAGNOSIS — R2242 Localized swelling, mass and lump, left lower limb: Secondary | ICD-10-CM

## 2024-01-04 DIAGNOSIS — L309 Dermatitis, unspecified: Secondary | ICD-10-CM | POA: Insufficient documentation

## 2024-01-04 MED ORDER — TRIAMCINOLONE ACETONIDE 0.1 % EX OINT: 1.0000 | TOPICAL_OINTMENT | Freq: Two times a day (BID) | CUTANEOUS | 6 refills | Status: AC

## 2024-01-04 MED ORDER — TACROLIMUS 0.1 % EX CREA: TOPICAL_CREAM | CUTANEOUS | 1 refills | Status: DC

## 2024-01-04 NOTE — Assessment & Plan Note (Signed)
 Nodule about 1 cm x 1 cm.  Feels rounded and cystic and is just below the skin.  Referral to dermatology for excision.

## 2024-01-04 NOTE — Assessment & Plan Note (Signed)
 Wrote for her to have tacrolimus .  Use a small amount of massage and into the rash twice a day.  If this is not effective she also has a prescription for triamcinolone  0.1% that she could use sparingly twice a day.

## 2024-01-04 NOTE — Progress Notes (Signed)
 Established Patient Office Visit  Subjective   Patient ID: Taylor Oneill, female    DOB: 10-09-1995  Age: 28 y.o. MRN: [id number]  Chief Complaint  Patient presents with   Rash    Under chin, neck, right arm    HPI Discussed the use of AI scribe software for clinical note transcription with the patient, who gave verbal consent to proceed.  History of Present Illness   Taylor Oneill is a 28 year old female who presents with a rash and a lump on her leg.  The rash began approximately two weeks ago, characterized by multiple small patches on her arm. She has two larger patches, one under her chin and the other on her chest wall.  No rash on her back or trunk.  They itch some.  She has a history of eczema from childhood and suspects the current rash may be similar.  She has not tried any medication for this  She also reports a hard lump on her left leg that has been present for about three years. The lump appeared following a fall and injury to the knee, but there is no associated scar. It is painful, especially upon palpation.     ROS    Objective:     BP 125/83   Pulse 80   Temp 98.6 F (37 C) (Oral)   Resp 20   Ht 5' 7 (1.702 m)   Wt 191 lb (86.6 kg)   LMP 01/01/2024 (Exact Date)   SpO2 95%   BMI 29.91 kg/m    Physical Exam Vitals and nursing note reviewed.  Constitutional:      Appearance: Normal appearance.  HENT:     Head: Normocephalic and atraumatic.  Eyes:     Conjunctiva/sclera: Conjunctivae normal.  Cardiovascular:     Rate and Rhythm: Normal rate and regular rhythm.  Pulmonary:     Effort: Pulmonary effort is normal.     Breath sounds: Normal breath sounds.  Musculoskeletal:     Right lower leg: No edema.     Left lower leg: No edema.  Skin:    General: Skin is warm and dry.         Comments: Salmon-colored shiny rash under her right chin and on her anterior chest wall.  Hide has a nodule that is superficial, feels rounded and is  tender to touch.  No erythema or edema.  Neurological:     Mental Status: She is alert and oriented to person, place, and time.  Psychiatric:        Mood and Affect: Mood normal.        Behavior: Behavior normal.        Thought Content: Thought content normal.        Judgment: Judgment normal.          No results found for any visits on 01/04/24.    The ASCVD Risk score (Arnett DK, et al., 2019) failed to calculate for the following reasons:   The 2019 ASCVD risk score is only valid for ages 34 to 34    Assessment & Plan:  Other eczema Assessment & Plan: Wrote for her to have tacrolimus .  Use a small amount of massage and into the rash twice a day.  If this is not effective she also has a prescription for triamcinolone  0.1% that she could use sparingly twice a day.  Orders: -     Tacrolimus ; Apply small amount to affected area and rub in  completely twice a day  Dispense: 30 g; Refill: 1 -     Triamcinolone  Acetonide; Apply 1 Application topically 2 (two) times daily. To affected areas  Dispense: 60 g; Refill: 6  Nodule of skin of left lower extremity Assessment & Plan: Nodule about 1 cm x 1 cm.  Feels rounded and cystic and is just below the skin.  Referral to dermatology for excision.  Orders: -     Ambulatory referral to Dermatology     Return if symptoms worsen or fail to improve.    Annsley Akkerman K Linetta Regner, MD

## 2024-01-17 ENCOUNTER — Ambulatory Visit

## 2024-01-17 DIAGNOSIS — L209 Atopic dermatitis, unspecified: Secondary | ICD-10-CM

## 2024-01-17 DIAGNOSIS — L728 Other follicular cysts of the skin and subcutaneous tissue: Secondary | ICD-10-CM

## 2024-01-17 DIAGNOSIS — L729 Follicular cyst of the skin and subcutaneous tissue, unspecified: Secondary | ICD-10-CM

## 2024-01-17 DIAGNOSIS — L308 Other specified dermatitis: Secondary | ICD-10-CM

## 2024-01-17 MED ORDER — TACROLIMUS 0.1 % EX OINT: TOPICAL_OINTMENT | CUTANEOUS | 5 refills | Status: AC

## 2024-01-17 NOTE — Patient Instructions (Addendum)
 Pre-Operative Instructions You are scheduled for a surgical procedure at [facility]. We recommend you read the following instructions. If you have any questions or concerns, please call the office at [phone].  Shower and wash the entire body with soap and water the day of your surgery paying special attention to cleansing at and around the planned surgery site.  Please continue to take your anticoagulants (blood thinners) as you normally     would before and after surgery if they were prescribed by a medical provider. Stopping them could be harmful to you. We have multiple tools in dermatology to stop the bleeding even if you take an anticoagulant. If you take over the counter blood thinner such as aspirin, Ibuprofen (Motrin, Advil and Nuprin), Naprosyn , Voltaren , Relafen, etc. that was not prescribed or recommended by a medical provider, we recommend that you stop taking it for a week before your surgery and wait to restart until 2 days after your surgery.  Please inform us  of all medications you are currently taking. All medications that are taken regularly should be taken the day of surgery as you always do. Nevertheless, we need to be informed of what medications you are taking prior to surgery to know whether they will affect the procedure or cause any complications.   Please inform us  of any medication allergies. Also inform us  of whether you have allergies to Latex or rubber products or whether you have had any adverse reaction to Lidocaine  or Epinephrine .  Please inform us  of any prosthetic or artificial body parts such as artificial heart valve, joint replacements, etc., or similar condition that might require preoperative antibiotics.   We recommend avoidance of alcohol at least two weeks prior to surgery and continued avoidance for at least two weeks after surgery.   We recommend discontinuation of tobacco smoking at least two weeks prior to surgery and continued  abstinence for at least two weeks after surgery.  Do not plan strenuous exercise, strenuous work or strenuous lifting for approximately four weeks after your surgery.   We request if you are unable to make your scheduled surgical appointment, please call us  at least a week in advance or as soon as you are aware of a problem so that we can cancel or reschedule the appointment.   You MAY TAKE TYLENOL  (acetaminophen ) for pain as it is not a blood thinner.   PLEASE PLAN TO BE IN TOWN FOR TWO WEEKS FOLLOWING SURGERY, THIS IS IMPORTANT SO YOU CAN BE CHECKED FOR DRESSING CHANGES, FUTURE REMOVAL AND TO MONITOR FOR POSSIBLE COMPLICATIONS.   Due to recent changes in healthcare laws, you may see results of your pathology and/or laboratory studies on MyChart before the doctors have had a chance to review them. We understand that in some cases there may be results that are confusing or concerning to you. Please understand that not all results are received at the same time and often the doctors may need to interpret multiple results in order to provide you with the best plan of care or course of treatment. Therefore, we ask that you please give us  2 business days to thoroughly review all your results before contacting the office for clarification. Should we see a critical lab result, you will be contacted sooner.   If You Need Anything After Your Visit  If you have any questions or concerns for your doctor, please call our main line at [phone] and press option 4 to reach your doctor's medical assistant. If no  one answers, please leave a voicemail as directed and we will return your call as soon as possible. Messages left after 4 pm will be answered the following business day.   You may also send us  a message via MyChart. We typically respond to MyChart messages within 1-2 business days.  For prescription refills, please ask your pharmacy to contact our office. Our fax number is [phone].  If you have  an urgent issue when the clinic is closed that cannot wait until the next business day, you can page your doctor at the number below.    Please note that while we do our best to be available for urgent issues outside of office hours, we are not available 24/7.   If you have an urgent issue and are unable to reach us , you may choose to seek medical care at your doctor's office, retail clinic, urgent care center, or emergency room.  If you have a medical emergency, please immediately call 911 or go to the emergency department.  Pager Numbers  - Dr. Hester: [phone]  - Dr. Jackquline: [phone]  - Dr. Claudene: [phone]   In the event of inclement weather, please call our main line at [phone] for an update on the status of any delays or closures.  Dermatology Medication Tips: Please keep the boxes that topical medications come in in order to help keep track of the instructions about where and how to use these. Pharmacies typically print the medication instructions only on the boxes and not directly on the medication tubes.   If your medication is too expensive, please contact our office at [phone] option 4 or send us  a message through MyChart.   We are unable to tell what your co-pay for medications will be in advance as this is different depending on your insurance coverage. However, we may be able to find a substitute medication at lower cost or fill out paperwork to get insurance to cover a needed medication.   If a prior authorization is required to get your medication covered by your insurance company, please allow us  1-2 business days to complete this process.  Drug prices often vary depending on where the prescription is filled and some pharmacies may offer cheaper prices.  The website www.goodrx.com contains coupons for medications through different pharmacies. The prices here do not account for what the cost may be with help from insurance (it may be cheaper with  your insurance), but the website can give you the price if you did not use any insurance.  - You can print the associated coupon and take it with your prescription to the pharmacy.  - You may also stop by our office during regular business hours and pick up a GoodRx coupon card.  - If you need your prescription sent electronically to a different pharmacy, notify our office through [facility] or by phone at [phone] option 4.     Si Usted Necesita Algo Despus de Su Visita  Tambin puede enviarnos un mensaje a travs de Clinical cytogeneticist. Por lo general respondemos a los mensajes de MyChart en el transcurso de 1 a 2 das hbiles.  Para renovar recetas, por favor pida a su farmacia que se ponga en contacto con nuestra oficina. Randi lakes de fax es [place] [phone].  Si tiene un asunto urgente cuando la clnica est cerrada y que no puede esperar hasta el siguiente da hbil, puede llamar/localizar a su doctor(a) al nmero que aparece a continuacin.   Por favor, tenga en  cuenta que aunque hacemos todo lo posible para estar disponibles para asuntos urgentes fuera del horario de [place], no estamos disponibles las 24 horas del da, los 7 [address]  [address] de la [place].   Si tiene un problema urgente y no puede comunicarse con nosotros, puede optar por buscar atencin mdica  en el consultorio de su doctor(a), en una clnica privada, en un centro de atencin urgente o en una sala de emergencias.  Si tiene Engineer, drilling, por favor llame inmediatamente al 911 o vaya a la sala de emergencias.  Nmeros de bper  - Dr. Hester: [phone]  - Dra. Jackquline: [phone]  - Dr. Claudene: [phone]   En caso de inclemencias del tiempo, por favor llame a landry capes principal al [phone] para una actualizacin sobre el [place] de cualquier retraso o cierre.  Consejos para la medicacin en dermatologa: Por favor, guarde las cajas en las que vienen los medicamentos de uso tpico para  ayudarle a seguir las instrucciones sobre dnde y cmo usarlos. Las farmacias generalmente imprimen las instrucciones del medicamento slo en las cajas y no directamente en los tubos del [employer].   Si su medicamento es muy caro, por favor, pngase en contacto con landry rieger llamando al [phone] y presione la opcin 4 o envenos un mensaje a travs de Clinical cytogeneticist.   No podemos decirle cul ser su copago por los medicamentos por adelantado ya que esto es diferente dependiendo de la cobertura de su seguro. Sin embargo, es posible que podamos encontrar un medicamento sustituto a Audiological scientist un formulario para que el seguro cubra el medicamento que se considera necesario.   Si se requiere una autorizacin previa para que su compaa de seguros malta su medicamento, por favor permtanos de 1 a 2 das hbiles para completar este proceso.  Los precios de los medicamentos varan con frecuencia dependiendo del Environmental consultant de dnde se surte la receta y alguna farmacias pueden ofrecer precios ms baratos.  El sitio web www.goodrx.com tiene cupones para medicamentos de Health and safety inspector. Los precios aqu no tienen en cuenta lo que podra costar con la ayuda del seguro (puede ser ms barato con su seguro), pero el sitio web puede darle el precio si no utiliz Tourist information centre manager.  - Puede imprimir el cupn correspondiente y llevarlo con su receta a la farmacia.  - Tambin puede pasar por nuestra oficina durante el horario de atencin regular y Education officer, museum una tarjeta de cupones de GoodRx.  - Si necesita que su receta se enve electrnicamente a una farmacia diferente, informe a nuestra oficina a travs de MyChart de [place] o por telfono llamando al [phone] y presione la opcin 4.

## 2024-01-17 NOTE — Progress Notes (Signed)
   New Patient Visit   Subjective  Taylor Oneill is a 28 y.o. female who presents for the following: Patches of rash going on for about three months, neck and right arm. Patient with hx of eczema when she was 12-13 and subsided. Patient avoid dairy in diet and seemed to go away. Currently, PCP prescribed TMC ointment uses it BID and helped clear rash.   Patient also has painful lump at left knee, painful to the touch, appeared two years ago after a fall and broke her knee cap.    The following portions of the chart were reviewed this encounter and updated as appropriate: medications, allergies, medical history  Review of Systems:  No other skin or systemic complaints except as noted in HPI or Assessment and Plan.  Objective  Well appearing patient in no apparent distress; mood and affect are within normal limits.  A focused examination was performed of the following areas: Neck, arms   - Diffuse xerosis with a few thin eczematous plaques of neck, arm  - 1 cm subcutaneous nodule at left lower anterior thigh   Relevant exam findings are noted in the Assessment and Plan.    Assessment & Plan   Atopic dermatitis - mild, BSA <1%  Chronic and persistent condition with duration or expected duration over one year. Condition is symptomatic and bothersome to patient. Patient is flaring and not currently at treatment goal.   - Diagnosis, treatment options, prognosis, risk/ benefit, and side effects of treatment were discussed with the patient.  - Reviewed benign but chronic nature of disease. - Discussed dry skin care at length, recommended avoidance of fragrances, short showers with luke- warm water, no scrubbing, an unscented moisturizing soap (e.g. Dove sensitive skin) limited to the groin and axillae, and frequent emollient use (Eucerin, Aquaphor, Cerave, Vanicream, Vaseline). - Discussed treatment with topical steroids, non steroidal topicals, systemics (dupixent, tralokinumab,  nemolizumab, rinvoq)  - Reviewed proper use of topical steroids to minimize the risk of steroid-induced skin changes.  - Also discussed appropriate dry skin care including daily warm baths with gentle soap, followed by liberal bland moisturizer application.  - A patient education handout reiterating this information was provided. - Start tacrolimus  0.1% ointment twice daily for maintenance / mild areas  - Can continue triamcinolone  0.1% ointment bid for more severe areas prn   Subcutaneous cyst at left lower anterior thigh - Explained to patient this most likely is consistent with an epidermal inclusion cyst, which represents trapped hair follicule and skin cells under the skin - Benign, patient reassured - Given that the lesion is symptomatic, patient would like to proceed with excision. They will be scheduled for surgical removal.   Request for FBSE - advised to schedule sometime within next year     Return for cyst surgery w/ Dr. Raymund, 1 year TBSE.  I, Jacquelynn V. Wilfred, CMA, am acting as scribe for Lauraine JAYSON Raymund, MD .   Documentation: I have reviewed the above documentation for accuracy and completeness, and I agree with the above.  Lauraine JAYSON Raymund, MD

## 2024-01-25 DIAGNOSIS — Z419 Encounter for procedure for purposes other than remedying health state, unspecified: Secondary | ICD-10-CM | POA: Diagnosis not present

## 2024-02-05 ENCOUNTER — Ambulatory Visit

## 2024-02-05 DIAGNOSIS — D485 Neoplasm of uncertain behavior of skin: Secondary | ICD-10-CM

## 2024-02-05 DIAGNOSIS — L720 Epidermal cyst: Secondary | ICD-10-CM

## 2024-02-05 DIAGNOSIS — L729 Follicular cyst of the skin and subcutaneous tissue, unspecified: Secondary | ICD-10-CM

## 2024-02-05 NOTE — Progress Notes (Addendum)
   Follow-Up Visit   Subjective  Taylor Oneill is a 28 y.o. female who presents for the following: surgical excision for cyst on the L lower ant thigh.  The following portions of the chart were reviewed this encounter and updated as appropriate: medications, allergies, medical history  Pacemaker/Defibrillator: Denies  Allergies: Yes - codeine Anticoagulants/Aspirin: Denies  Heart valves: Denies  Joint replacements:  Denies      Review of Systems:  No other skin or systemic complaints except as noted in HPI or Assessment and Plan.  Objective  Well appearing patient in no apparent distress; mood and affect are within normal limits.   A focused examination was performed of the following areas: the legs   Relevant exam findings are noted in the Assessment and Plan.  L lower ant thigh Cystic nodule - 1.0 cm .  Assessment & Plan   NEOPLASM OF UNCERTAIN BEHAVIOR OF SKIN L lower ant thigh Skin excision  Excision method:  elliptical Lesion length (cm):  1 Margin per side (cm):  0 Total excision diameter (cm):  2.5 Informed consent: discussed and consent obtained   Timeout: patient name, date of birth, surgical site, and procedure verified   Procedure prep:  Patient was prepped and draped in usual sterile fashion Prep type:  Chlorhexidine Anesthesia: the lesion was anesthetized in a standard fashion   Anesthesia comment:  6 cc Anesthetic:  1% lidocaine w/ epinephrine 1-100,000 buffered w/ 8.4% NaHCO3 Instrument used: #15 blade   Hemostasis achieved with: suture, pressure and electrodesiccation   Outcome: patient tolerated procedure well with no complications    Skin repair Complexity:  Intermediate Final length (cm):  2.5 Informed consent: discussed and consent obtained   Timeout: patient name, date of birth, surgical site, and procedure verified   Procedure prep:  Patient was prepped and draped in usual sterile fashion Prep type:  Chlorhexidine Anesthesia: the  lesion was anesthetized in a standard fashion   Anesthetic:  1% lidocaine w/ epinephrine 1-100,000 buffered w/ 8.4% NaHCO3 Reason for type of repair: reduce tension to allow closure, reduce the risk of dehiscence, infection, and necrosis, reduce subcutaneous dead space and avoid a hematoma, allow closure of the large defect and preserve normal anatomy   Undermining: edges could be approximated without difficulty   Subcutaneous layers (deep stitches):  Suture size:  3-0 Suture type: PDS (polydioxanone)   Stitches:  Buried vertical mattress Fine/surface layer approximation (top stitches):  Suture size:  5-0 Suture type: Prolene (polypropylene)   Stitches: simple running   Suture removal (days):  14 Hemostasis achieved with: suture, pressure and electrodesiccation Outcome: patient tolerated procedure well with no complications   Post-procedure details: sterile dressing applied and wound care instructions given   Dressing type: petrolatum, bandage and pressure dressing     Return for suture removal in 7-10 days.  [name] [name], CMA, am acting as scribe for [name], MD .   Documentation: I have reviewed the above documentation for accuracy and completeness, and I agree with the above.  [name], MD

## 2024-02-05 NOTE — Patient Instructions (Addendum)

## 2024-02-07 ENCOUNTER — Telehealth: Payer: Self-pay

## 2024-02-07 ENCOUNTER — Ambulatory Visit: Payer: Self-pay

## 2024-02-07 LAB — SURGICAL PATHOLOGY

## 2024-02-07 NOTE — Telephone Encounter (Signed)
 Left voicemail to return my call if she is having any complications following surgery on 02/05/24.

## 2024-02-07 NOTE — Progress Notes (Signed)
Patient informed and voiced good understanding.

## 2024-02-14 ENCOUNTER — Ambulatory Visit (INDEPENDENT_AMBULATORY_CARE_PROVIDER_SITE_OTHER)

## 2024-02-14 DIAGNOSIS — Z48817 Encounter for surgical aftercare following surgery on the skin and subcutaneous tissue: Secondary | ICD-10-CM

## 2024-02-14 NOTE — Patient Instructions (Signed)

## 2024-02-14 NOTE — Progress Notes (Signed)
   Follow-Up Visit   Subjective  Kiaria Quinnell Marcon is a 28 y.o. female who presents for the following: Suture removal  Pathology showed EPIDERMOID CYST, INFLAMED AND DISRUPTED   The following portions of the chart were reviewed this encounter and updated as appropriate: medications, allergies, medical history  Review of Systems:  No other skin or systemic complaints except as noted in HPI or Assessment and Plan.  Objective  Well appearing patient in no apparent distress; mood and affect are within normal limits.  Areas Examined: Left lower anterior thigh   Relevant physical exam findings are noted in the Assessment and Plan.    Assessment & Plan    Encounter for Removal of Sutures - Incision site is clean, dry and intact. - Wound cleansed, sutures removed, wound cleansed and steri strips applied.  - Discussed pathology results showing EPIDERMOID CYST, INFLAMED AND DISRUPTED  - Patient advised to keep steri-strips dry until they fall off. - Scars remodel for a full year. - Once steri-strips fall off, patient can apply over-the-counter silicone scar cream once to twice a day to help with scar remodeling if desired. - Patient advised to call with any concerns or if they notice any new or changing lesions.  No follow-ups on file.  Fay Kirks

## 2024-03-01 ENCOUNTER — Ambulatory Visit
Admission: EM | Admit: 2024-03-01 | Discharge: 2024-03-01 | Disposition: A | Discharge status: Home / Self Care | Attending: Family Medicine | Admitting: Family Medicine

## 2024-03-01 DIAGNOSIS — S71102A Unspecified open wound, left thigh, initial encounter: Secondary | ICD-10-CM

## 2024-03-01 DIAGNOSIS — T8130XA Disruption of wound, unspecified, initial encounter: Secondary | ICD-10-CM | POA: Diagnosis not present

## 2024-03-01 MED ORDER — CEPHALEXIN 500 MG PO CAPS: 500.0000 mg | ORAL_CAPSULE | Freq: Three times a day (TID) | ORAL | 0 refills | Status: AC

## 2024-03-01 MED ORDER — ONDANSETRON 4 MG PO TBDP
4.0000 mg | ORAL_TABLET | Freq: Once | ORAL | Status: AC
  Administered 2024-03-01: 4 mg via ORAL

## 2024-03-01 NOTE — Discharge Instructions (Signed)
 Keep area clean and dry do not remove the dressing for 24 hours.  After that you may remove it and gently cleanse the area but do not soak or saturate.  Start Keflex 3 times a day for 5 days to help prevent infection.  You may return to clinic in 10 days for suture removal.  I hope you feel better soon!

## 2024-03-01 NOTE — ED Triage Notes (Signed)
 Patient reports that a cyst was previously removed from the area just above the left knee. Sutures were removed on 10/2. Today, while bending to pick up a child, the site reopened. There is noted bloody drainage within the wound (scant), no odor noted.
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
## 2024-03-12 ENCOUNTER — Encounter: Payer: Self-pay | Admitting: Family Medicine

## 2024-03-12 ENCOUNTER — Ambulatory Visit (INDEPENDENT_AMBULATORY_CARE_PROVIDER_SITE_OTHER): Admitting: Family Medicine

## 2024-03-12 VITALS — BP 125/84 | HR 83 | Temp 98.7°F | Resp 20 | Ht 67.0 in | Wt 190.0 lb

## 2024-03-12 DIAGNOSIS — Z4889 Encounter for other specified surgical aftercare: Secondary | ICD-10-CM | POA: Diagnosis not present

## 2024-03-12 DIAGNOSIS — F324 Major depressive disorder, single episode, in partial remission: Secondary | ICD-10-CM | POA: Diagnosis not present

## 2024-03-12 DIAGNOSIS — N760 Acute vaginitis: Secondary | ICD-10-CM | POA: Diagnosis not present

## 2024-03-12 MED ORDER — FLUCONAZOLE 150 MG PO TABS: 150.0000 mg | ORAL_TABLET | Freq: Once | ORAL | 0 refills | Status: AC

## 2024-03-12 NOTE — Assessment & Plan Note (Signed)
 She scored a 10 on PHQ9 questionnaire.  She denies HI and SI.  She is working with a therapist, sports and is on Cymbalta .  She has got an appointment to psychiatrist next week.  She feels safe.

## 2024-03-12 NOTE — Progress Notes (Addendum)
 Established Patient Office Visit  Subjective   Patient ID: Taylor Oneill, female    DOB: Jun 07, 1995  Age: 28 y.o. MRN: [id number]  Chief Complaint  Patient presents with   Vaginitis   Suture / Staple Removal   Cough    Yellow mucus    Suture / Staple Removal  Cough   Discussed the use of AI scribe software for clinical note transcription with the patient, who gave verbal consent to proceed.  History of Present Illness   Taylor Oneill is a 28 year old female who presents with concerns about sutures and a yeast infection.  She had a cyst removed on September 29th, and the original sutures were removed on October 2nd. After a fall on October 18th, the area reopened. She is concerned about the current sutures, noting that one on the left side is loose and has started to slide. She has been applying Vaseline to the area and is worried about the sutures popping open again, as it is a high-stress area and she can feel the stitches pulling.  Following the cyst removal, she was placed on antibiotics, which led to a yeast infection. She has had Diflucan  before for similar issues and reports that it 'works wonders'. She is not allergic to Diflucan .            Objective:     BP 125/84 (BP Location: Left Arm, Patient Position: Sitting, Cuff Size: Normal)   Pulse 83   Temp 98.7 F (37.1 C) (Oral)   Resp 20   Ht 5' 7 (1.702 m)   Wt 190 lb (86.2 kg)   SpO2 95%   BMI 29.76 kg/m    Physical Exam Vitals reviewed.  Constitutional:      Appearance: Normal appearance.  HENT:     Head: Normocephalic.  Eyes:     General:        Right eye: No discharge.        Left eye: No discharge.  Cardiovascular:     Rate and Rhythm: Normal rate.  Pulmonary:     Effort: Pulmonary effort is normal.  Musculoskeletal:     Comments: Has three sutures.  Incision clean, dry and intact.    Neurological:     Mental Status: She is alert and oriented to person, place, and time.   Psychiatric:        Mood and Affect: Mood normal.        Behavior: Behavior normal.        Thought Content: Thought content normal.        Judgment: Judgment normal.          No results found for any visits on 03/12/24.    The ASCVD Risk score (Arnett DK, et al., 2019) failed to calculate for the following reasons:   The 2019 ASCVD risk score is only valid for ages 52 to 39    Assessment & Plan:  Acute vaginitis -     NuSwab Vaginitis Plus (VG+)  Vulvovaginitis Assessment & Plan: She took ABX for her thigh wound and now has a yeast infection.  Diflucan  150mg  times one,,    Orders: -     Fluconazole ; Take 1 tablet (150 mg total) by mouth once for 1 dose.  Dispense: 1 tablet; Refill: 0  Suture check Assessment & Plan: She has only had these sutures in for 11 days.  Incision is clean, dry and intact.  Concerned she will have wound dehiscence if the sutures  are removed now.  I would wait another 7-10 days to remove these.     Depression, major, single episode, in partial remission Assessment & Plan: She scored a 10 on PHQ9 questionnaire.  She denies HI and SI.  She is working with a therapist, sports and is on Cymbalta .  She has got an appointment to psychiatrist next week.  She feels safe.      No follow-ups on file.    Travoris Bushey K Montavis Schubring, MD

## 2024-03-12 NOTE — Assessment & Plan Note (Signed)
 She took ABX for her thigh wound and now has a yeast infection.  Diflucan  150mg  times one,,

## 2024-03-12 NOTE — Assessment & Plan Note (Signed)
 She has only had these sutures in for 11 days.  Incision is clean, dry and intact.  Concerned she will have wound dehiscence if the sutures are removed now.  I would wait another 7-10 days to remove these.

## 2024-03-12 NOTE — Addendum Note (Signed)
 Addended by: Charly Holcomb K on: 03/12/2024 02:27 PM   Modules accepted: Level of Service

## 2024-03-14 ENCOUNTER — Ambulatory Visit: Payer: Self-pay | Admitting: Family Medicine

## 2024-03-14 LAB — NUSWAB VAGINITIS PLUS (VG+)
Candida albicans, NAA: NEGATIVE
Candida glabrata, NAA: NEGATIVE
Chlamydia trachomatis, NAA: NEGATIVE
Neisseria gonorrhoeae, NAA: NEGATIVE
Trich vag by NAA: NEGATIVE

## 2024-03-19 ENCOUNTER — Ambulatory Visit (INDEPENDENT_AMBULATORY_CARE_PROVIDER_SITE_OTHER): Admitting: Family Medicine

## 2024-03-19 ENCOUNTER — Encounter: Payer: Self-pay | Admitting: Family Medicine

## 2024-03-19 VITALS — BP 133/89 | HR 91 | Resp 16 | Ht 67.0 in | Wt 191.0 lb

## 2024-03-19 DIAGNOSIS — F902 Attention-deficit hyperactivity disorder, combined type: Secondary | ICD-10-CM | POA: Diagnosis not present

## 2024-03-19 DIAGNOSIS — J069 Acute upper respiratory infection, unspecified: Secondary | ICD-10-CM

## 2024-03-19 DIAGNOSIS — F411 Generalized anxiety disorder: Secondary | ICD-10-CM | POA: Diagnosis not present

## 2024-03-19 DIAGNOSIS — Z4802 Encounter for removal of sutures: Secondary | ICD-10-CM

## 2024-03-19 DIAGNOSIS — F3181 Bipolar II disorder: Secondary | ICD-10-CM | POA: Diagnosis not present

## 2024-03-19 MED ORDER — FLUCONAZOLE 150 MG PO TABS: ORAL_TABLET | ORAL | 0 refills | Status: AC

## 2024-03-19 MED ORDER — AMOXICILLIN-POT CLAVULANATE 875-125 MG PO TABS: 1.0000 | ORAL_TABLET | Freq: Two times a day (BID) | ORAL | 0 refills | Status: AC

## 2024-03-19 NOTE — Progress Notes (Signed)
 Established Patient Office Visit  Subjective  Patient ID: Taylor Oneill, female    DOB: 09/23/1995  Age: 28 y.o. MRN: [id number]  Chief Complaint  Patient presents with   Suture / Staple Removal    Left lower Thigh wound    Cough    2 weeks of cough and sinus drainage neon yellow no fever no wheezing     Discussed the use of AI scribe software for clinical note transcription with the patient, who gave verbal consent to proceed.  History of Present Illness   Taylor Oneill is a 28 year old female who presents with possible suture removal and a persistent cough with sinus congestion.  She had a cyst removed from her lower thigh, just above the knee, in September. The initial stitches were removed on October 2nd, and the wound healed well. 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 She has been managing the wound with Neosporin, antibacterial soap, and keeping it clean. There is some skin peeling around the stitch sites but no significant scabbing. She initially used an ace bandage to immobilize the area but stopped due to hip pain.  She reports a cough and sinus congestion for the past couple of weeks, with nasal discharge described as 'neon yellowy green.' The cough worsens in the evenings and when lying down, making it difficult to catch her breath. She experiences tight congestion around the bridge of her nose and frequent nasal discharge. 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 No ear pain but mentions headaches across her forehead and occasional voice loss without sore throat. She recalls being on Cefalexin recently for her knee and has a history of needing Diflucan  when on antibiotics.     ROS: see HPI     Objective:     BP 133/89   Pulse 91   Resp 16   Ht 5' 7 (1.702 m)   Wt 191 lb (86.6 kg)   SpO2  96%   BMI 29.91 kg/m  BP Readings from Last 3 Encounters:  03/19/24 133/89  03/12/24 125/84  03/01/24 127/83     Physical Exam Vitals reviewed.  Constitutional:      Appearance: Normal appearance.  HENT:     Right Ear: Tympanic membrane, ear canal and external ear normal.     Left Ear: Tympanic membrane, ear canal and external ear normal.     Nose: Congestion present. No rhinorrhea.     Right Turbinates: Swollen.     Left Turbinates: Swollen.     Right Sinus: Maxillary sinus tenderness and frontal sinus tenderness present.     Left Sinus: Maxillary sinus tenderness and frontal sinus tenderness present.     Mouth/Throat:     Mouth: Mucous membranes are moist.     Pharynx: Oropharynx is clear. No oropharyngeal exudate, posterior oropharyngeal erythema, uvula swelling or postnasal drip.     Tonsils: No tonsillar exudate or tonsillar abscesses.  Cardiovascular:     Rate and Rhythm: Normal rate and regular rhythm.     Pulses: Normal pulses.     Heart sounds: Normal heart sounds.  Pulmonary:     Effort: Pulmonary effort is normal.     Breath sounds: Normal breath sounds. No wheezing.  Neurological:     Mental Status: She is alert.  Psychiatric:  Mood and Affect: Mood normal.        Behavior: Behavior normal.      Assessment & Plan:   1. Acute URI (Primary) Diagnosis of sinus infection based on symptom duration and presentation. Symptoms include yellow-green nasal discharge, tight congestion, worsening cough at night, no ear pain or sore throat, occasional voice loss. Lungs clear to auscultation. Prescribed Augmentin  twice a day for 7 days for acute URI. Prescribed Diflucan , one tablet after completing Augmentin  and another in three days, to prevent yeast infection. Advised monitoring for symptom improvement within 2-3 days and completing the antibiotic course.  - amoxicillin -clavulanate (AUGMENTIN ) 875-125 MG tablet; Take 1 tablet by mouth 2 (two) times daily for 7 days.   Dispense: 14 tablet; Refill: 0 - fluconazole  (DIFLUCAN ) 150 MG tablet; Take 1 tablet (150mg ) after completing Augmentin . After 72 hours, take last 1 tablet (150mg ).  Dispense: 2 tablet; Refill: 0  2. Visit for suture removal Suture removal and wound care following cyst excision, lower thigh/above left knee. Sutures removed from lower thigh cyst excision site. Site tender with skin peeling, no significant scabbing. Sutures previously replaced due to dehiscence, currently loose with one knot on opening. No significant bleeding during removal. Provided Steri-Strips for home use. Advised keeping the wound open to air to promote scab formation and healing. Instructed to monitor for signs of infection such as increased redness, tenderness, or pus drainage.   Return if symptoms worsen or fail to improve.    Taylor Arts, FNP

## 2024-03-19 NOTE — Progress Notes (Signed)
 Copywriter, advertising letter for work

## 2024-03-26 DIAGNOSIS — Z419 Encounter for procedure for purposes other than remedying health state, unspecified: Secondary | ICD-10-CM | POA: Diagnosis not present

## 2024-04-25 DIAGNOSIS — Z419 Encounter for procedure for purposes other than remedying health state, unspecified: Secondary | ICD-10-CM | POA: Diagnosis not present

## 2024-04-28 ENCOUNTER — Ambulatory Visit: Payer: Self-pay

## 2024-04-28 DIAGNOSIS — H6691 Otitis media, unspecified, right ear: Secondary | ICD-10-CM | POA: Diagnosis not present

## 2024-04-28 NOTE — Telephone Encounter (Signed)
 FYI Only or Action Required?: FYI only for provider: UC.  Patient was last seen in primary care on 03/19/2024 by Towana Small, FNP.  Called Nurse Triage reporting Sinusitis.  Symptoms began several days ago.  Interventions attempted: Rest, hydration, or home remedies.  Symptoms are: gradually worsening.  Triage Disposition: See HCP Within 4 Hours (Or PCP Triage)  Patient/caregiver understands and will follow disposition?: Yes  Copied from CRM #[id number]. Topic: Clinical - Red Word Triage >> Apr 28, 2024  9:21 AM Burnard DEL wrote: Red Word that prompted transfer to Nurse Triage: congestion,tightness in chest,hard to breathe,productive cough >> Apr 28, 2024  9:39 AM Burnard DEL wrote: Patient no longer wanted to hold.Se would like a call back from a nurse Reason for Disposition  [1] MILD difficulty breathing (e.g., minimal/no SOB at rest, SOB with walking, pulse < 100) AND [2] NEW-onset or WORSE than normal  Answer Assessment - Initial Assessment Questions Bad sinus and chest congestin causing mild SOB worse with coughing. Started Thrus and got worse Saturday- chest tight with coughing so muhc. Hard to get deep breath.  Denies CP, Fever.   Advised UC- will get dressed and go now. Feels ok to drive. Advised pull over if dizzy hits and call EMS  1. RESPIRATORY STATUS: Describe your breathing? (e.g., wheezing, shortness of breath, unable to speak, severe coughing)      SOB, productive cough, mild dizzy 2. ONSET: When did this breathing problem begin?      Thursday-- worsened Saturday 3. PATTERN Does the difficult breathing come and go, or has it been constant since it started?      Worse with coughing fits. Short winded 4. SEVERITY: How bad is your breathing? (e.g., mild, moderate, severe)      Mild SOB worse with cough  5. RECURRENT SYMPTOM: Have you had difficulty breathing before? If Yes, ask: When was the last time? and What happened that time?      denies 6. CARDIAC  HISTORY: Do you have any history of heart disease? (e.g., heart attack, angina, bypass surgery, angioplasty)      denies 7. LUNG HISTORY: Do you have any history of lung disease?  (e.g., pulmonary embolus, asthma, emphysema)     denies 8. CAUSE: What do you think is causing the breathing problem?      Sinus/chest congestion  9. OTHER SYMPTOMS: Do you have any other symptoms? (e.g., chest pain, cough, dizziness, fever, runny nose)     Cough, sinus/chest congestion, mild dizziness 10. O2 SATURATION MONITOR:  Do you use an oxygen saturation monitor (pulse oximeter) at home? If Yes, ask: What is your reading (oxygen level) today? What is your usual oxygen saturation reading? (e.g., 95%)       Doesn't own monitor 11. PREGNANCY: Is there any chance you are pregnant? When was your last menstrual period?       UTA  Protocols used: Breathing Difficulty-A-AH

## 2024-04-30 DIAGNOSIS — F411 Generalized anxiety disorder: Secondary | ICD-10-CM | POA: Diagnosis not present

## 2024-04-30 DIAGNOSIS — F902 Attention-deficit hyperactivity disorder, combined type: Secondary | ICD-10-CM | POA: Diagnosis not present

## 2024-04-30 DIAGNOSIS — F3181 Bipolar II disorder: Secondary | ICD-10-CM | POA: Diagnosis not present

## 2025-01-21 ENCOUNTER — Ambulatory Visit
